# Patient Record
Sex: Male | Born: 1970 | Race: Black or African American | Hispanic: No | Marital: Single | Smoking: Former smoker
Health system: Southern US, Community
[De-identification: ages and names within clinical notes are randomized; demographics above are authoritative.]

## PROBLEM LIST (undated history)

## (undated) DIAGNOSIS — R0989 Other specified symptoms and signs involving the circulatory and respiratory systems: Secondary | ICD-10-CM

## (undated) DIAGNOSIS — I219 Acute myocardial infarction, unspecified: Secondary | ICD-10-CM

## (undated) DIAGNOSIS — H547 Unspecified visual loss: Secondary | ICD-10-CM

## (undated) DIAGNOSIS — G4730 Sleep apnea, unspecified: Secondary | ICD-10-CM

## (undated) DIAGNOSIS — I10 Essential (primary) hypertension: Secondary | ICD-10-CM

## (undated) DIAGNOSIS — K219 Gastro-esophageal reflux disease without esophagitis: Secondary | ICD-10-CM

## (undated) DIAGNOSIS — I509 Heart failure, unspecified: Secondary | ICD-10-CM

## (undated) DIAGNOSIS — F6381 Intermittent explosive disorder: Secondary | ICD-10-CM

## (undated) DIAGNOSIS — Z87448 Personal history of other diseases of urinary system: Secondary | ICD-10-CM

## (undated) HISTORY — DX: Gastro-esophageal reflux disease without esophagitis: K21.9

## (undated) HISTORY — DX: Other specified symptoms and signs involving the circulatory and respiratory systems: R09.89

## (undated) HISTORY — DX: Essential (primary) hypertension: I10

## (undated) HISTORY — DX: Intermittent explosive disorder: F63.81

## (undated) HISTORY — DX: Personal history of other diseases of urinary system: Z87.448

## (undated) HISTORY — DX: Heart failure, unspecified: I50.9

## (undated) HISTORY — DX: Sleep apnea, unspecified: G47.30

## (undated) HISTORY — DX: Acute myocardial infarction, unspecified: I21.9

## (undated) HISTORY — DX: Unspecified visual loss: H54.7

---

## 1997-10-12 ENCOUNTER — Encounter: Admission: RE | Admit: 1997-10-12 | Discharge: 1998-01-10 | Payer: Self-pay | Admitting: Nephrology

## 1998-01-26 ENCOUNTER — Encounter: Admission: RE | Admit: 1998-01-26 | Discharge: 1998-04-26 | Payer: Self-pay | Admitting: Nephrology

## 1998-03-01 ENCOUNTER — Emergency Department (HOSPITAL_COMMUNITY): Admission: EM | Admit: 1998-03-01 | Discharge: 1998-03-01 | Payer: Self-pay | Admitting: Emergency Medicine

## 1998-11-23 ENCOUNTER — Encounter: Admission: RE | Admit: 1998-11-23 | Discharge: 1999-02-21 | Payer: Self-pay | Admitting: Nephrology

## 1998-12-23 ENCOUNTER — Ambulatory Visit (HOSPITAL_COMMUNITY): Admission: RE | Admit: 1998-12-23 | Discharge: 1998-12-23 | Payer: Self-pay | Admitting: Nephrology

## 1999-06-27 ENCOUNTER — Encounter: Admission: RE | Admit: 1999-06-27 | Discharge: 1999-09-25 | Payer: Self-pay | Admitting: Nephrology

## 1999-12-08 ENCOUNTER — Encounter: Admission: RE | Admit: 1999-12-08 | Discharge: 2000-03-07 | Payer: Self-pay | Admitting: Nephrology

## 2000-08-02 ENCOUNTER — Encounter: Admission: RE | Admit: 2000-08-02 | Discharge: 2000-10-31 | Payer: Self-pay | Admitting: Nephrology

## 2000-11-13 ENCOUNTER — Encounter: Admission: RE | Admit: 2000-11-13 | Discharge: 2001-02-11 | Payer: Self-pay | Admitting: Nephrology

## 2001-04-19 ENCOUNTER — Encounter: Admission: RE | Admit: 2001-04-19 | Discharge: 2001-07-18 | Payer: Self-pay | Admitting: Nephrology

## 2001-04-24 ENCOUNTER — Emergency Department (HOSPITAL_COMMUNITY): Admission: EM | Admit: 2001-04-24 | Discharge: 2001-04-24 | Payer: Self-pay | Admitting: *Deleted

## 2001-07-05 ENCOUNTER — Emergency Department (HOSPITAL_COMMUNITY): Admission: EM | Admit: 2001-07-05 | Discharge: 2001-07-05 | Payer: Self-pay | Admitting: Emergency Medicine

## 2001-10-10 ENCOUNTER — Encounter: Admission: RE | Admit: 2001-10-10 | Discharge: 2002-01-08 | Payer: Self-pay | Admitting: Nephrology

## 2002-06-03 ENCOUNTER — Encounter: Admission: RE | Admit: 2002-06-03 | Discharge: 2002-09-01 | Payer: Self-pay | Admitting: Nephrology

## 2002-09-25 ENCOUNTER — Ambulatory Visit (HOSPITAL_BASED_OUTPATIENT_CLINIC_OR_DEPARTMENT_OTHER): Admission: RE | Admit: 2002-09-25 | Discharge: 2002-09-25 | Payer: Self-pay | Admitting: Nephrology

## 2002-11-04 ENCOUNTER — Encounter: Admission: RE | Admit: 2002-11-04 | Discharge: 2003-02-02 | Payer: Self-pay | Admitting: Nephrology

## 2002-11-27 ENCOUNTER — Ambulatory Visit (HOSPITAL_BASED_OUTPATIENT_CLINIC_OR_DEPARTMENT_OTHER): Admission: RE | Admit: 2002-11-27 | Discharge: 2002-11-27 | Payer: Self-pay | Admitting: Nephrology

## 2003-02-03 ENCOUNTER — Encounter: Admission: RE | Admit: 2003-02-03 | Discharge: 2003-05-04 | Payer: Self-pay | Admitting: Nephrology

## 2003-05-05 ENCOUNTER — Encounter: Admission: RE | Admit: 2003-05-05 | Discharge: 2003-08-03 | Payer: Self-pay | Admitting: Nephrology

## 2003-08-03 ENCOUNTER — Encounter: Admission: RE | Admit: 2003-08-03 | Discharge: 2003-11-01 | Payer: Self-pay | Admitting: Nephrology

## 2003-11-02 ENCOUNTER — Encounter: Admission: RE | Admit: 2003-11-02 | Discharge: 2003-11-02 | Payer: Self-pay | Admitting: Nephrology

## 2004-02-02 ENCOUNTER — Encounter: Admission: RE | Admit: 2004-02-02 | Discharge: 2004-02-25 | Payer: Self-pay | Admitting: Nephrology

## 2004-05-25 ENCOUNTER — Encounter: Admission: RE | Admit: 2004-05-25 | Discharge: 2004-05-25 | Payer: Self-pay | Admitting: Nephrology

## 2004-11-23 ENCOUNTER — Encounter: Admission: RE | Admit: 2004-11-23 | Discharge: 2005-02-21 | Payer: Self-pay | Admitting: Nephrology

## 2004-11-26 ENCOUNTER — Emergency Department (HOSPITAL_COMMUNITY): Admission: EM | Admit: 2004-11-26 | Discharge: 2004-11-26 | Payer: Self-pay | Admitting: Emergency Medicine

## 2005-03-07 ENCOUNTER — Encounter: Admission: RE | Admit: 2005-03-07 | Discharge: 2005-06-05 | Payer: Self-pay | Admitting: Nephrology

## 2005-08-29 ENCOUNTER — Encounter: Admission: RE | Admit: 2005-08-29 | Discharge: 2005-11-27 | Payer: Self-pay | Admitting: Nephrology

## 2005-11-28 ENCOUNTER — Encounter: Admission: RE | Admit: 2005-11-28 | Discharge: 2005-12-20 | Payer: Self-pay | Admitting: Nephrology

## 2006-04-26 ENCOUNTER — Emergency Department (HOSPITAL_COMMUNITY): Admission: EM | Admit: 2006-04-26 | Discharge: 2006-04-26 | Payer: Self-pay | Admitting: Emergency Medicine

## 2006-05-02 ENCOUNTER — Encounter: Admission: RE | Admit: 2006-05-02 | Discharge: 2006-05-02 | Payer: Self-pay | Admitting: Nephrology

## 2006-05-17 ENCOUNTER — Encounter: Admission: RE | Admit: 2006-05-17 | Discharge: 2006-08-15 | Payer: Self-pay | Admitting: Nephrology

## 2006-08-16 ENCOUNTER — Encounter: Admission: RE | Admit: 2006-08-16 | Discharge: 2006-11-14 | Payer: Self-pay | Admitting: Nephrology

## 2006-12-27 ENCOUNTER — Encounter: Admission: RE | Admit: 2006-12-27 | Discharge: 2006-12-27 | Payer: Self-pay | Admitting: Nephrology

## 2007-03-26 ENCOUNTER — Encounter: Admission: RE | Admit: 2007-03-26 | Discharge: 2007-03-26 | Payer: Self-pay | Admitting: Nephrology

## 2007-06-25 ENCOUNTER — Encounter: Admission: RE | Admit: 2007-06-25 | Discharge: 2007-06-25 | Payer: Self-pay | Admitting: Nephrology

## 2007-09-24 ENCOUNTER — Encounter: Admission: RE | Admit: 2007-09-24 | Discharge: 2007-09-24 | Payer: Self-pay | Admitting: Nephrology

## 2007-12-24 ENCOUNTER — Encounter: Admission: RE | Admit: 2007-12-24 | Discharge: 2007-12-24 | Payer: Self-pay | Admitting: Nephrology

## 2008-03-24 ENCOUNTER — Encounter: Admission: RE | Admit: 2008-03-24 | Discharge: 2008-05-13 | Payer: Self-pay | Admitting: Nephrology

## 2008-07-16 ENCOUNTER — Encounter: Admission: RE | Admit: 2008-07-16 | Discharge: 2008-07-16 | Payer: Self-pay | Admitting: Nephrology

## 2008-10-15 ENCOUNTER — Encounter: Admission: RE | Admit: 2008-10-15 | Discharge: 2008-10-15 | Payer: Self-pay | Admitting: Nephrology

## 2009-02-17 ENCOUNTER — Encounter: Admission: RE | Admit: 2009-02-17 | Discharge: 2009-02-17 | Payer: Self-pay | Admitting: Nephrology

## 2009-08-31 ENCOUNTER — Encounter: Admission: RE | Admit: 2009-08-31 | Discharge: 2009-08-31 | Payer: Self-pay | Admitting: Nephrology

## 2010-02-15 ENCOUNTER — Encounter: Admission: RE | Admit: 2010-02-15 | Discharge: 2010-04-07 | Payer: Self-pay | Admitting: Nephrology

## 2010-05-23 ENCOUNTER — Encounter
Admission: RE | Admit: 2010-05-23 | Discharge: 2010-05-23 | Payer: Self-pay | Source: Home / Self Care | Attending: Nephrology | Admitting: Nephrology

## 2010-06-13 ENCOUNTER — Ambulatory Visit: Payer: Self-pay | Admitting: Vascular Surgery

## 2010-06-20 ENCOUNTER — Emergency Department (HOSPITAL_COMMUNITY)
Admission: EM | Admit: 2010-06-20 | Discharge: 2010-06-20 | Payer: Self-pay | Source: Home / Self Care | Admitting: Emergency Medicine

## 2010-08-29 ENCOUNTER — Encounter: Admit: 2010-08-29 | Payer: Self-pay | Admitting: Nephrology

## 2010-08-29 ENCOUNTER — Encounter: Payer: Medicaid Other | Attending: Nephrology | Admitting: Dietician

## 2010-08-29 DIAGNOSIS — E119 Type 2 diabetes mellitus without complications: Secondary | ICD-10-CM | POA: Insufficient documentation

## 2010-08-29 DIAGNOSIS — Z713 Dietary counseling and surveillance: Secondary | ICD-10-CM | POA: Insufficient documentation

## 2010-11-22 NOTE — Procedures (Signed)
CAROTID DUPLEX EXAM   INDICATION:  Calcifications on previous study at the dental office.   HISTORY:  Diabetes:  Yes.  Cardiac:  No.  Hypertension:  Yes.  Smoking:  No.  Previous Surgery:  No.  CV History:  History of vertigo.  Amaurosis Fugax No, Paresthesias No, Hemiparesis No                                       RIGHT             LEFT  Brachial systolic pressure:         136               134  Brachial Doppler waveforms:         WNL               WNL  Vertebral direction of flow:        Antegrade         Antegrade  DUPLEX VELOCITIES (cm/sec)  CCA peak systolic                   89                81  ECA peak systolic                   117               74  ICA peak systolic                   62                77  ICA end diastolic                   18                19  PLAQUE MORPHOLOGY:                  Heterogeneous     Heterogeneous  PLAQUE AMOUNT:                      Minimal           Minimal  PLAQUE LOCATION:                    Proximal ICA      Proximal ICA   IMPRESSION:  Bilaterally no hemodynamically significant stenosis.  Bilateral intimal thickening within the common carotid arteries.   ___________________________________________  Di Kindle. Edilia Bo, M.D.   OD/MEDQ  D:  06/13/2010  T:  06/13/2010  Job:  [id number]

## 2010-11-29 ENCOUNTER — Encounter: Payer: Medicaid Other | Attending: Nephrology | Admitting: Dietician

## 2010-11-29 DIAGNOSIS — Z713 Dietary counseling and surveillance: Secondary | ICD-10-CM | POA: Insufficient documentation

## 2010-11-29 DIAGNOSIS — E119 Type 2 diabetes mellitus without complications: Secondary | ICD-10-CM | POA: Insufficient documentation

## 2011-01-04 DIAGNOSIS — R238 Other skin changes: Secondary | ICD-10-CM

## 2011-01-04 DIAGNOSIS — I10 Essential (primary) hypertension: Secondary | ICD-10-CM | POA: Insufficient documentation

## 2011-01-04 DIAGNOSIS — J45909 Unspecified asthma, uncomplicated: Secondary | ICD-10-CM | POA: Insufficient documentation

## 2011-01-04 DIAGNOSIS — K219 Gastro-esophageal reflux disease without esophagitis: Secondary | ICD-10-CM | POA: Insufficient documentation

## 2011-01-04 DIAGNOSIS — R0989 Other specified symptoms and signs involving the circulatory and respiratory systems: Secondary | ICD-10-CM | POA: Insufficient documentation

## 2011-01-04 DIAGNOSIS — I509 Heart failure, unspecified: Secondary | ICD-10-CM | POA: Insufficient documentation

## 2011-01-04 DIAGNOSIS — M791 Myalgia, unspecified site: Secondary | ICD-10-CM | POA: Insufficient documentation

## 2011-01-04 DIAGNOSIS — E119 Type 2 diabetes mellitus without complications: Secondary | ICD-10-CM | POA: Insufficient documentation

## 2011-01-04 DIAGNOSIS — F419 Anxiety disorder, unspecified: Secondary | ICD-10-CM | POA: Insufficient documentation

## 2011-01-04 DIAGNOSIS — F70 Mild intellectual disabilities: Secondary | ICD-10-CM | POA: Insufficient documentation

## 2011-01-04 DIAGNOSIS — G4730 Sleep apnea, unspecified: Secondary | ICD-10-CM | POA: Insufficient documentation

## 2011-03-07 ENCOUNTER — Ambulatory Visit: Payer: Medicaid Other | Admitting: Dietician

## 2011-03-14 ENCOUNTER — Encounter: Payer: Medicaid Other | Attending: Nephrology | Admitting: Dietician

## 2011-03-14 ENCOUNTER — Encounter: Payer: Self-pay | Admitting: Dietician

## 2011-03-14 DIAGNOSIS — E119 Type 2 diabetes mellitus without complications: Secondary | ICD-10-CM | POA: Insufficient documentation

## 2011-03-14 DIAGNOSIS — Z713 Dietary counseling and surveillance: Secondary | ICD-10-CM | POA: Insufficient documentation

## 2011-03-14 DIAGNOSIS — I10 Essential (primary) hypertension: Secondary | ICD-10-CM

## 2011-03-14 NOTE — Progress Notes (Signed)
  Medical Nutrition Therapy:  Appt start time: 1530 end time:  1600.  Assessment:  Primary concerns today: Diabetes self-management follow-up.  Continues to be cooperative with his current caregiver.  During the summer months has gotten a little less exercise according to his caregiver, but he continues to participates in a number of activities that require him to be active.  He has gained 6 lb since his Kennis Buell 2012 appointment, but still remains at a BMI of 25%.  Reported blood glucose ranges ar at: fasting= 95-105-106 mg and 8:00 PM= 115-135 mg (Depends on his evening meal intake.  If he goes out for a meal, the levels tend to be higher.)  Staff report that they continue to check his feet daily when applying the cream that is prescribed and that he has his nails trimmed every 2-3 months by the podiatrist. MEDICATIONS: Review of medications reveals that her continues the Glipizide  XL continues at 10 mg 2 tablets every morning.  Has had Desmopressin 0.2 mg/day added and the Lorazapam 1 mg was decreased to 1 pill at bedtime. DIETARY INTAKE: 24-hr recall:  B (8:00 AM): Cheerios or oatmeal of a cereal with nuts, milk , and a fruit. Snk ( AM) :None noted  L (12:00-1:00 PM): Sandwich with chips fruit, and a water  Snk (4:00 PM): A pack of peanut butter crackers. D (6:00 PM): A meat, a starch serving and a vegetable.  Snk (PM): None as a rule.    Recent physical activity: Walking at [employer] and bowling.  Progress Towards Goal(s):  In progress.   Nutritional Diagnosis:  [place]-2.1 Inpaired nutrition utilization As related to glucose.  As evidenced by diagnosis of type 2 diabetes and use of the Glipizide XL 20 mg dao;u.    Intervention:  Nutrition Review of current intake and the need for a bedtime snack if the blood glucose level at 8:00 PM is 100 or less mg.  Monitoring/Evaluation:  Dietary intake, exercise, blood glucose levels , and body weight with follow-up in three months.Marland Kitchen

## 2011-06-13 ENCOUNTER — Encounter: Payer: Self-pay | Admitting: Dietician

## 2011-06-13 ENCOUNTER — Encounter: Payer: Medicaid Other | Attending: Nephrology | Admitting: Dietician

## 2011-06-13 DIAGNOSIS — Z713 Dietary counseling and surveillance: Secondary | ICD-10-CM | POA: Insufficient documentation

## 2011-06-13 DIAGNOSIS — E119 Type 2 diabetes mellitus without complications: Secondary | ICD-10-CM | POA: Insufficient documentation

## 2011-06-13 NOTE — Progress Notes (Signed)
  Medical Nutrition Therapy:  Appt start time: 1530 end time:  1600.  Assessment:  Primary concerns today:Diabetes Management follow-up  Weight today is at 149.6 lb, which is a 3.4 lb gain over the last 3 months.  Staying at around 150 lbs gives him a BMI of =/- 25% . He had lost to 140 lbs and this left him limited reserve if he experienced a major illness.  He continues to live at the group home and her has the same group home coordinator Dennie Bible.  Dennie Bible is respectful, patient, and supportive of Ryan Case.  He reports that over the last 2 years, they have not had any issues with him eating in the middle of the night.  He is cooperative regarding his diet.  He enjoys eating out and would eat out every day if the staff would allow it.  Continues to have regular outings with his mother.    MEDICATIONS: Now taking Desmopressin to help with preventing nocturia.  Staff are trying to make sure he is well hydrated earlier in the day yet, have fewer accidents at night.  He appears to take water throughout the day.    DIETARY INTAKE:64in, 149.6 lb, BMI 25.7%.  1600-1700 calories to  to maintain this weight.  Recent physical activity: [place] when at the work site and walking in the park on days at home and on weekends.   Progress Towards Goal(s):  In progress.   Nutritional Diagnosis:  [place]-2.1 Inpaired nutrition utilization As related to glucose.  As evidenced by diagnosis of type 2 diabetes and elevated fasting and post-meal glucose levels..    Intervention:  Nutrition Continue with regular meals and snacks.  Continue to monitor intake during the holidays.    Monitoring/Evaluation:  Dietary intake, exercise, blood glucose levels, and body weight in three months.

## 2011-09-12 ENCOUNTER — Encounter: Payer: Medicaid Other | Attending: Nephrology | Admitting: Dietician

## 2011-09-12 ENCOUNTER — Encounter: Payer: Self-pay | Admitting: Dietician

## 2011-09-12 DIAGNOSIS — E119 Type 2 diabetes mellitus without complications: Secondary | ICD-10-CM | POA: Insufficient documentation

## 2011-09-12 DIAGNOSIS — Z713 Dietary counseling and surveillance: Secondary | ICD-10-CM | POA: Insufficient documentation

## 2011-09-12 NOTE — Progress Notes (Signed)
  Medical Nutrition Therapy:  Appt start time: 1530 end time:  1600.  Assessment:  Primary concerns today: Follow- up for blood glucose control and weight maintenance.   His care taker reports that over the last 3 months he has been stable.  No illnesses or emotional incidences have occurred.  He is actively involved with the others in his group home.  If anything, he Ravis Herne not be getting as much physical activity due to the rainy, cold weather that we have had.  He continues to have weekly or more often, outings for dinner with his Mom.  He Kathryn Linarez have a higher HS glucose on those days, but her generally remains in a stable range.  BLOOD GLUCOSE LEVELS:  Fasting range 95-106-105mg   Bedtime range: 115-150 mg  MEDICATIONS: Review completed with the provider using his MAR.  Continues to have medications that he Hector Venne be using on a PRN basis.  DIETARY INTAKE:  24-hr recall:  B (7:30-8:00 AM): Cereal 1 bowl (raisin bran or corn flakes, or regular cherrioes) milk 8 oz, fruit on side.  Glass of OJ or water Snk (mid AM) :Has lunch at 11:00  L (11-11:30 PM): 2 sandwiches (meat/cheese) bag of chips, piece of fruit, pack of Lance crackers.   Can be divided up.  Water or Crystal Light  Snk (late PM): None unless hungry or feeling glucose changing. D (6:00-6:30 PM): Out with his mother.  At the house, will have a meat, baked chicken, mashed potatoes and broccoli and corn mix.  Water or diet soda.  Snk (HS PM): Only if he ask for one and his blood glucose is appropriate Beverages: water, crystal light.  Recent physical activity: walking, and when goes to gym will walk and shoot some basketball.  Estimated energy needs:HT:64 in  WT: 147.7 lb BMI: 25.4%  Desire is to keep his weight stable around 147-152 lb to have some reserve energy in the case of a major illness.  Given his status, his immunity Tharun Cappella be compromised, leaving him to need some, but not a great deal of reserve energy stores.  1500-1600  calories 170-175 g carbohydrates 115-120 g protein 41-43 g fat  Progress Towards Goal(s):  Some progress.   Nutritional Diagnosis:  [place]-2.1 Inpaired nutrition utilization As related to glucose.  As evidenced by diagnosis of type 2 diabetes and varying and above normal fasting and post meal blood glucose levels.    Intervention:  Nutrition Review of current nutrient intake and strategies with the caregiver to help with weight maintainence .   Monitoring/Evaluation:  Dietary intake, exercise, blood glucose levels, and body weight in three months.

## 2011-09-12 NOTE — Patient Instructions (Signed)
   Keep up the regular meals and snacks that restrict the carbohydrates.  Continue to check his blood glucose twice daily and if he complains of the low glucose, then check blood sugar and treat.  At the next visit with Dr. Bascom Levels have him send [place] a referral to continue to regular blood glucose/diabetes counseling and weight control counseling.

## 2011-09-13 ENCOUNTER — Encounter: Payer: Self-pay | Admitting: Dietician

## 2011-12-12 ENCOUNTER — Encounter: Payer: Medicaid Other | Attending: Nephrology | Admitting: Dietician

## 2011-12-12 DIAGNOSIS — E119 Type 2 diabetes mellitus without complications: Secondary | ICD-10-CM | POA: Insufficient documentation

## 2011-12-12 DIAGNOSIS — Z713 Dietary counseling and surveillance: Secondary | ICD-10-CM | POA: Insufficient documentation

## 2011-12-12 NOTE — Patient Instructions (Signed)
   If having a high blood glucose reading, have him wash his hands and do a second reading.  He Jonay Hitchcock have had something on his hands that would have made the reading high or it might have been a bad drop of blood.  Continue to get the physical activity on the weekends.   Continue to walk at school.   Any time you have questions regarding his meter or it's function, give me a call.  When he has his annual physical with Dr. Bascom Levels, please call me and leave me the results of his A1C level.  Maggie Tedford Berg [phone].

## 2011-12-12 NOTE — Progress Notes (Signed)
  Medical Nutrition Therapy:  Appt start time: 1530 end time:  1600.  Assessment:  Primary concerns today: Continued blood glucose control.  Weight gain of 5 lb over the last 3 months.  Had only this months blood glucose records. Had one markedly elevated blood glucose of 167.  This they treated with hydration.  Otherwise, his glucose levels are fasting for this time in the 98-103 range and his post-meal evening records are in the general < 160 mg.dl range.  His diet and activity levels are remaining basically unchanged.       BLOOD GLUCOSE: 167,99, 98, 103 EVENING (8:00 PM): 122, 116, 141  MEDICATIONS: Medication review with the staff using the MAR.  Has had some medication changes.  For his blood glucose, he remains on the Glipizide XL 20 mg daily.  DIETARY INTAKE:  24-hr recall:  B (7:30-8:00 AM): Continues with the cereal 1 bowl (raisin bran or regular cheerios) with 2 % milk and a fruit on the side.  Will have water or a glass of OJ.  Snk (mid AM) :Usually no snack.  Lunch is at 11:00 AM  L (11:00-11:30 PM): Carries a bag lunch from home.  Ryan Case have 2 sandwiches (meat/cheese) with a bag of chips or a piece of fruit, and will have a pack of the Lance crackers.  Can use these items for snack later in the afternoon.  Snk (mid PM): Usually no snack unless he is hungry or he feels that his blood sugar is running low. D (6:00-6:30 PM): At the house, will have a lean meat or chicken and a starch and a non-starchy vegetable.  When out with his Mom, she tries to make sure that he eats to keep a good blood glucose level. snk (HS PM): Usually none unless he is hungry or has a low blood glucose Beverages: water, water with crystal light.  Limiting the diet soda  Recent physical activity: Continues to participate with the walking at the job site on a daily basis during the week when the weather is good.  Otherwise, his exercise/physical activity is on the weekend.  The staff will take them to the park,  bowling, to the movies, to a restaurant.  Estimated energy needs: 1500-1600 calories 170-175 g carbohydrates 115-120 g protein 41-43 g fat  Progress Towards Goal(s):  In progress.   Nutritional Diagnosis:  [place]-2.1 Inpaired nutrition utilization As related to blood glucose.  As evidenced by diagnosis of type 2 diabetes and use of Glipizide XL for glucose control along with a carb restricted diet..    Intervention:  Nutrition: Reviewed the need to continue to restrict his carb intake and to keep him as physically active as possible.  Again, the goal is to keep his weight at the 147-152 range to have some reserve energy should he experience an illness.   Monitoring/Evaluation:  Dietary intake, exercise, blood glucose levels, and body weight in three months.

## 2011-12-13 ENCOUNTER — Encounter: Payer: Self-pay | Admitting: Dietician

## 2012-06-19 ENCOUNTER — Encounter: Payer: Self-pay | Admitting: Dietician

## 2012-06-19 ENCOUNTER — Encounter: Payer: Medicaid Other | Attending: Nephrology | Admitting: Dietician

## 2012-06-19 DIAGNOSIS — E119 Type 2 diabetes mellitus without complications: Secondary | ICD-10-CM | POA: Insufficient documentation

## 2012-06-19 DIAGNOSIS — Z713 Dietary counseling and surveillance: Secondary | ICD-10-CM | POA: Insufficient documentation

## 2012-06-19 NOTE — Progress Notes (Signed)
Medical Nutrition Therapy:  Appt start time: 1515 end time:  1545.  Assessment:  Primary concerns today: Continuing to keep his diabetes stable. Comes today with the caregiver for his group home setting.  The caregiver is requesting to have his A1C drawn.  The care team is wanting to know where he stands with his diabetes.  He reports that he is not aware that the A1C is being done.  Explained that that is a service that we are no longer able to provide for our patients.  Noted that we will need to get Dr. Bascom Levels to get the A1C with his next blood draw.   Jaynie Collins has gained 2.8 lb since his visit in June 2013.  His BMI is at 26.8 kg/m2.  For him this weigh gain is OK.  Discussed with the caregiver the need to try to keep him in the 150-155 lb range.  Caregiver notes that at this time it is not difficult to help [name] maintain good eating habits at the house, but that when he is out for visitation, he will come back with an elevated blood glucose.  His favorite outing is to eat at the [employer].  His mom will often take him there to eat.  It would be good to see how his A1C stands in relation to his daily blood glucose levels.  BLOOD GLUCOSE MONITORING: Staff did not bring his glucose meter or records.  Did an informal recall.   Fasting: 87-90-102  Evening 8:00 PM  Nights eating at the group home: 115-140  Nights on going to dinner: 160-161.  HYPOGLYCEMIA:  Staff report no incidents of the S/S of low blood glucose.  HYPERGLYCEMIA:  Staff report no incidents of the S/S of hight blood glucose. MEDICATIONS: Completed Med review.  DIETARY INTAKE:  24-hr recall:  B (7:30-8:00 AM): Continues with the cereal (1 bowl of raisin bran or regular cheerios) with 2% milk and a fruit on the side.  Will have water or a glass of OJ.  On the weekends, may have a small waffle instead of the fruit.  Snk (mid AM) :none  L (11:00 AM): Carries a bag lunch from home.  May have 2 sandwiches  (meat/cheese) with a bag of chips or a piece of fruit.   Snk (mid PM): If he is hungry or his sugar is lower, will have a pack of Lance crackers. D (6:00-6:30 PM): At the house, will have a lean meat or chicken and a starch and a non-starchy vegetable.  When out with his mom, he prefers to eat the fried fish.  As a rule she will try to persuade him to try something with less fat and carb.  Snk (HS PM): Usually none unless he is hungry or has a low blood glucose level. Beverages: water, water with crystal light.  Continuing to limit diet soda.  Recent physical activity: Staff note that he is walking more with his !:1 at the day program.  He is less resistant to walking and has been walking longer and further.  He will walk on the warmer days.  On weekends, the group may go for a walk at the park.  Estimated energy needs:   HT: 64 in  WT: 155.8 lb   BMI: 26.8 kg/m2   Adj WT: 140 lb (64 kg) 1500-1600 calories 170-175 g carbohydrates 115-120 g protein 41-43 g fat  Progress Towards Goal(s):  In progress.   Nutritional Diagnosis:  [place]-2.1 Inpaired nutrition utilization As  related to glucose.  As evidenced by diagnosis of type 2 diabetes and the use of Glipiside XL for blood glucoe control along with a carb restricted diet..    Intervention:  Nutrition Review of the need to continue to monitor and limit carb intake.  Recommended continuing to try to keep him as active as possible and to continue to encourage his walking.  Continue to moderate his portions and monitor his blood glucose levels and aim for the fastin glucose levels of 90-110 mg/dl and the 2 hours after the first bite of the meal at 120-150 mg/dl. Continue to use the fruits and to limit the use of the starchy carbs.  Try for using the fruit rather than the juice.   Monitoring/Evaluation:  Dietary intake, exercise, blood glucose levels, and body weight in three months.Marland Kitchen

## 2012-09-17 ENCOUNTER — Encounter: Payer: Medicaid Other | Attending: Nephrology | Admitting: Dietician

## 2012-09-17 ENCOUNTER — Encounter: Payer: Self-pay | Admitting: Dietician

## 2012-09-17 DIAGNOSIS — E119 Type 2 diabetes mellitus without complications: Secondary | ICD-10-CM | POA: Insufficient documentation

## 2012-09-17 DIAGNOSIS — Z713 Dietary counseling and surveillance: Secondary | ICD-10-CM | POA: Insufficient documentation

## 2012-09-17 NOTE — Progress Notes (Signed)
  Medical Nutrition Therapy:  Appt start time: 1515 end time:  1545.  Assessment:  Primary concerns today: F/U for diet, blood glucose and weight control.  Comes today with his care giver from his group home living situation.  Staff reported that his habits have remained much the same.  He may be walking more with the group staff members at the work site.  His mother continues to see him on a regular basis and he looks forward to their outings to eat.  He especially enjoys going to the [employer].  Since his last visit on 06/19/2012, he has lost 3 lbs.   HYPERGLYCEMIA:  Staff report no S/S that he has made them aware of.  HYPOGLYCEMIA:  No blood glucose levels less than the 70 mg.range.  BLOOD GLUCOSE MONITORING:  Continues to monitor twice daily, fasting and after dinner/before bed.    Fasting: 90-140 mg    HS: 98-140 mg   MEDICATIONS: Medication review completed/  DIETARY INTAKE:  24-hr recall:  B (7:30-8;00 AM): cerea Raisin Ban with 2 % milk or  Waffles/pancakes 2 pancakes with a bacon or sausage. Syrup sugar free OR oatmeal  Package, uses regular with Splenda Snk (mid AM) :none  L (11:30 PM): 2 baloney and or [place] or ham sandwich with bag chips, apple or orange , water.  Snk (mid  PM): PB crackers D (6:30-7:30 PM): Hamburger helper, corn and collard greens, water.  Snk (night  PM): Situational will have package of PB crackers.  Beverages: Having primarily water, diet soda having 1 glass 1-2 times per week.  Not drinking a lot  Recent physical activity: Day center making greater effort for having more activity.  Estimated energy needs:  HT: 64 in  WT:152.8 lb  BMI: 26.3 kg/m2   1500-1600 calories 170-175 g carbohydrates 115-120 g protein 41-43 g fat  Progress Towards Goal(s):  Some progress.   Nutritional Diagnosis:  [place]-2.1 Inpaired nutrition utilization As related to glucose.  As evidenced by diagnosis of type 2 diabetes with the use of Glipisise XL for  blood glucose control along with a carb restricted diet .    Intervention:  Nutrition:  If his glucose at bedtime is at 90 mg, then off him the cheese and crackers.  If the glucose is 110 mg or greater, then he will not need a snack.  Continue to help him to stay active with his walking.  Needs the walking on a daily basis for the glucose control.  Keep up the protein/meats in his diet.  Continue to use lean protein sources.  If he is asking for more foods, try to use the lean proteins rather than a starch or other carb.  If using a protein bar, look for a whey based protein bar.  F/U in 3 months.  Monitoring/Evaluation:  Dietary intake, exercise, blood glucose , and body weight in 3 months.

## 2012-10-05 ENCOUNTER — Encounter: Payer: Self-pay | Admitting: Dietician

## 2012-10-05 NOTE — Patient Instructions (Signed)
   If his glucose at bedtime is at 90 mg, then off him the cheese and crackers.  If the glucose is 110 mg or greater, then he will not need a snack.  Continue to help him to stay active with his walking.  Needs the walking on a daily basis for the glucose control.  Keep up the protein/meats in his diet.  Continue to use lean protein sources.  If he is asking for more foods, try to use the lean proteins rather than a starch or other carb.  If using a protein bar, look for a whey based protein bar.  F/U in 3 months.

## 2013-02-18 ENCOUNTER — Encounter: Payer: Self-pay | Admitting: *Deleted

## 2013-02-18 ENCOUNTER — Encounter: Payer: Medicaid Other | Attending: Family Medicine | Admitting: *Deleted

## 2013-02-18 DIAGNOSIS — Z713 Dietary counseling and surveillance: Secondary | ICD-10-CM | POA: Insufficient documentation

## 2013-02-18 DIAGNOSIS — R7309 Other abnormal glucose: Secondary | ICD-10-CM | POA: Insufficient documentation

## 2013-02-18 NOTE — Progress Notes (Signed)
  Medical Nutrition Therapy:  Appt start time: 1530 end time:  1600.  Assessment:  Primary concerns today: F/U for diet, blood glucose and weight control. He has previously worked with Seward Grater May, RN, RD, CDE who has retired so this is my first visit with this patient.  Mother is here with Ryan Case for the appointment. She states he is eating well, is careful with his food choices and attempts to exercise daily by walking . They are in need of a new meter and will need a Rx for strips and lancets also. Weight is stable since last visit in March at 152.3 lbs. Mother states he was testing about once a day until he ran out of strips.  HYPERGLYCEMIA:  Staff report no S/S that he has made them aware of.  HYPOGLYCEMIA:  No reported blood glucose levels less than the 70 mg.range.  MEDICATIONS: Medication review completed/  Recent physical activity: Day center making greater effort for having more activity.  Estimated energy needs:  HT: 64 in  WT:152.8 lb  BMI: 26.3 kg/m2   1500-1600 calories 170-175 g carbohydrates 115-120 g protein 41-43 g fat  Progress Towards Goal(s):  Some progress.   Nutritional Diagnosis:  [place]-2.1 Inpaired nutrition utilization As related to glucose.  As evidenced by diagnosis of type 2 diabetes with the use of Glipisise XL for blood glucose control along with a carb restricted diet .    Intervention:  Nutrition reviewed and deemed appropriate at this time.   If his glucose at bedtime is at 90 mg, then off him the cheese and crackers.  If the glucose is 110 mg or greater, then he will not need a snack.  Continue to help him to stay active with his walking.  Needs the walking on a daily basis for the glucose control.  Keep up the protein/meats in his diet.  Continue to use lean protein sources.  If he is asking for more foods, try to use the lean proteins rather than a starch or other carb.  If using a protein bar, look for a whey based protein bar.  Ask MD for Rx for  Accu Chek Nano meter strips and Fast Clix Lancing Device Drum which holds 6 lancets each  If he breaks out in a cold sweat or feels shaky, check his blood sugar to see if it is too low (below 70 mg/dl). If so, drink 4 oz fruit juice or regular soda to bring blood sugar up quickly.  F/U in 3 months.   Monitoring/Evaluation:  Dietary intake, exercise, blood glucose , and body weight in 3 months.

## 2013-02-18 NOTE — Patient Instructions (Addendum)
   If his glucose at bedtime is at 90 mg, then off him the cheese and crackers.  If the glucose is 110 mg or greater, then he will not need a snack.  Continue to help him to stay active with his walking.  Needs the walking on a daily basis for the glucose control.  Keep up the protein/meats in his diet.  Continue to use lean protein sources.  If he is asking for more foods, try to use the lean proteins rather than a starch or other carb.  If using a protein bar, look for a whey based protein bar.  Ask MD for Rx for Accu Chek Nano meter strips and Fast Clix Lancing Device Drum which holds 6 lancets each  If he breaks out in a cold sweat or feels shaky, check his blood sugar to see if it is too low (below 70 mg/dl). If so, drink 4 oz fruit juice or regular soda to bring blood sugar up quickly.  F/U in 3 months.

## 2013-02-21 ENCOUNTER — Emergency Department (HOSPITAL_COMMUNITY)
Admission: EM | Admit: 2013-02-21 | Discharge: 2013-02-21 | Disposition: A | Discharge status: Home / Self Care | Payer: Medicaid Other | Source: Home / Self Care

## 2013-02-21 ENCOUNTER — Encounter (HOSPITAL_COMMUNITY): Payer: Self-pay | Admitting: Emergency Medicine

## 2013-02-21 DIAGNOSIS — S0003XA Contusion of scalp, initial encounter: Secondary | ICD-10-CM

## 2013-02-21 DIAGNOSIS — S0012XA Contusion of left eyelid and periocular area, initial encounter: Secondary | ICD-10-CM

## 2013-02-21 NOTE — ED Notes (Signed)
Left face pain around left eye, visible bruising around left eye.  Patient reports running into wall during the night while walking to the bathroom.  No change in vision.

## 2013-02-21 NOTE — ED Provider Notes (Signed)
Ryan Case is a 42 y.o. male who presents to Urgent Care today for left eyebrow contusion. Patient has MRI or and lives in a group home. 2 nights ago he was walking in the house at night and accidentally walked into a door jam. He apparently does this with some regularity. He was not wearing his glasses at that time. He suffered a contusion to the left eyebrow. He denies any pain blurry vision and feels well otherwise. He has not tried any medications. No fevers or chills nausea vomiting or diarrhea.   PMH reviewed. As above History  Substance Use Topics  . Smoking status: Never Smoker   . Smokeless tobacco: Never Used  . Alcohol Use: No   ROS as above Medications reviewed. No current facility-administered medications for this encounter.   Current Outpatient Prescriptions  Medication Sig Dispense Refill  . amitriptyline (ELAVIL) 25 MG tablet Take 25 mg by mouth at bedtime.        Marland Kitchen aspirin 81 MG tablet Take 81 mg by mouth daily.        . benztropine (COGENTIN) 1 MG tablet Take 1 mg by mouth daily.        . Betamethasone Valerate (LUXIQ EX) Apply 17 % topically as needed.        . carvedilol (COREG) 3.125 MG tablet Take 3.125 mg by mouth 2 (two) times daily with a meal.        . ciclopirox (LOPROX) 0.77 % cream Apply topically as needed.        . desmopressin (DDAVP) 0.2 MG tablet Take 0.2 mg by mouth daily.        Marland Kitchen Dextrose, Diabetic Use, (GLUCOSE PO) Take by mouth.        . digoxin (LANOXIN) 0.25 MG tablet Take 250 mcg by mouth daily.        . famotidine (PEPCID) 20 MG tablet Take 20 mg by mouth daily.        . fenofibrate (TRICOR) 145 MG tablet Take 48 mg by mouth daily.       . furosemide (LASIX) 40 MG tablet Take 40 mg by mouth daily.        Marland Kitchen gabapentin (NEURONTIN) 400 MG capsule Take 400 mg by mouth 3 (three) times daily. Takes all three capsules at bedtime.      Marland Kitchen glipiZIDE (GLIPIZIDE XL) 10 MG 24 hr tablet Take 20 mg by mouth daily.        . Loratadine 10 MG CAPS Take by  mouth daily. Taking PRN.      . LORazepam (ATIVAN) 1 MG tablet Take 1 mg by mouth daily.       Marland Kitchen LORAZEPAM PO Take 0.5 mg by mouth daily.       . Metoclopramide HCl 10 MG TBDP Take 1 tablet by mouth 2 (two) times daily.        Marland Kitchen OMEPRAZOLE PO Take 20 mg by mouth daily.        Marland Kitchen POTASSIUM CHLORIDE CR PO Take 10 mg by mouth 2 (two) times daily.        . risperiDONE (RISPERDAL) 3 MG tablet Take 3 mg by mouth 2 (two) times daily.        . theophylline (THEODUR) 200 MG 12 hr tablet Take 200 mg by mouth 2 (two) times daily.          Exam:  BP 124/64  Pulse 74  Temp(Src) 98.5 F (36.9 C) (Oral)  Resp 18  SpO2 98%  Gen: Well NAD HEENT: EOMI,  MMM PERRLA. Lungs: CTABL Nl WOB Heart: RRR no MRG Abd: NABS, NT, ND Exts: Non edematous BL  LE, warm and well perfused.  Skin: Contusion and ecchymosis on the inferior portion of the left eyebrow. No erythema  No results found for this or any previous visit (from the past 24 hour(s)). No results found.  Assessment and Plan: 42 y.o. male with eyebrow contusion.  Watchful waiting. Tylenol as needed for pain. Followup if not improving or worsening. Discussed warning signs or symptoms. Please see discharge instructions. Patient expresses understanding.      Rodolph Bong, MD 02/21/13 [phone]

## 2013-04-29 ENCOUNTER — Ambulatory Visit (INDEPENDENT_AMBULATORY_CARE_PROVIDER_SITE_OTHER): Payer: Medicaid Other

## 2013-04-29 VITALS — BP 124/75 | HR 72 | Resp 20 | Ht 66.0 in | Wt 155.0 lb

## 2013-04-29 DIAGNOSIS — E1140 Type 2 diabetes mellitus with diabetic neuropathy, unspecified: Secondary | ICD-10-CM

## 2013-04-29 DIAGNOSIS — M79609 Pain in unspecified limb: Secondary | ICD-10-CM

## 2013-04-29 DIAGNOSIS — E1149 Type 2 diabetes mellitus with other diabetic neurological complication: Secondary | ICD-10-CM

## 2013-04-29 DIAGNOSIS — B351 Tinea unguium: Secondary | ICD-10-CM

## 2013-04-29 DIAGNOSIS — E1142 Type 2 diabetes mellitus with diabetic polyneuropathy: Secondary | ICD-10-CM

## 2013-04-29 NOTE — Progress Notes (Signed)
  Subjective:    Patient ID: Ryan Case, male    DOB: [birth year], 42 y.o.   MRN: [id number] "Trim my toenails."   HPI no changes in health or history patient has thick brittle crumbly different dystrophic probably criptotic nails tender on palpation and pushing. History of diabetes unchanged no changes medication her health history    Review of Systems Not reviewed    Objective:   Physical Exam  Neurovascular status is unchanged pedal pulses DP pulse 2 for PT thready palpable bilateral skin temperature warm to cool turgor diminished no edema no varicosities noted nails thick brittle dystrophic friable criptotic x10 tender on palpation with enclosed shoe wear. Biomechanical exam unremarkable mild flexible digital contractures noted 2 through 5 bilateral      Assessment & Plan:  Onychomycosis, diabetes with peripheral neuropathy. Painful mycotic nails debrided x10 the presence diabetes and complicated factors. Return for palliative nail care in the future on an as-needed basis maintain appropriate candidate shoes at all times. Alvan Dame DPM

## 2013-04-29 NOTE — Patient Instructions (Signed)

## 2013-05-20 ENCOUNTER — Encounter: Payer: Medicaid Other | Attending: Family Medicine | Admitting: *Deleted

## 2013-05-20 VITALS — Ht 66.0 in | Wt 156.7 lb

## 2013-05-20 DIAGNOSIS — R7309 Other abnormal glucose: Secondary | ICD-10-CM | POA: Insufficient documentation

## 2013-05-20 DIAGNOSIS — Z713 Dietary counseling and surveillance: Secondary | ICD-10-CM | POA: Insufficient documentation

## 2013-05-20 DIAGNOSIS — E119 Type 2 diabetes mellitus without complications: Secondary | ICD-10-CM

## 2013-05-20 NOTE — Progress Notes (Signed)
  Medical Nutrition Therapy:  Appt start time: 1530 end time:  1600.  Assessment:  Primary concerns today: F/U for diet, blood glucose and weight control. Patient is here with his caregiver today instead of his mother. He states he continues to eat well, is careful with his food choices and attempts to exercise daily by walking . He has obtained a new meter and is testing his BG successfully. Weight is up slightly since last visit in June at 156.7 lbs. No problems offered today.  MEDICATIONS: Medication review completed/  Recent physical activity: Day center making greater effort for having more activity.  Estimated energy needs:  1500-1600 calories 170-175 g carbohydrates 115-120 g protein 41-43 g fat  Progress Towards Goal(s):  Some progress.   Nutritional Diagnosis:  [place]-2.1 Inpaired nutrition utilization As related to glucose.  As evidenced by diagnosis of type 2 diabetes with the use of Glipisise XL for blood glucose control along with a carb restricted diet .    Intervention:  Nutrition reviewed and deemed appropriate at this time. Continue:  If his glucose at bedtime is at 90 mg, then off him the cheese and crackers.  If the glucose is 110 mg or greater, then he will not need a snack.  Continue to help him to stay active with his walking.  Needs the walking on a daily basis for the glucose control.  Keep up the protein/meats in his diet.  Continue to use lean protein sources.  If he is asking for more foods, try to use the lean proteins rather than a starch or other carb.  If using a protein bar, look for a whey based protein bar.  If he breaks out in a cold sweat or feels shaky, check his blood sugar to see if it is too low (below 70 mg/dl). If so, drink 4 oz fruit juice or regular soda to bring blood sugar up quickly.  F/U in 3 months.   Monitoring/Evaluation:  Dietary intake, exercise, blood glucose , and body weight in 3 months.

## 2013-06-02 ENCOUNTER — Encounter: Payer: Self-pay | Admitting: *Deleted

## 2013-08-05 ENCOUNTER — Ambulatory Visit: Payer: Medicaid Other

## 2013-08-20 ENCOUNTER — Ambulatory Visit: Payer: Medicaid Other | Admitting: *Deleted

## 2013-08-21 ENCOUNTER — Encounter: Payer: Medicaid Other | Attending: Family Medicine | Admitting: *Deleted

## 2013-08-21 ENCOUNTER — Encounter: Payer: Self-pay | Admitting: *Deleted

## 2013-08-21 VITALS — Ht 66.0 in | Wt 155.0 lb

## 2013-08-21 DIAGNOSIS — E119 Type 2 diabetes mellitus without complications: Secondary | ICD-10-CM

## 2013-08-21 DIAGNOSIS — R7309 Other abnormal glucose: Secondary | ICD-10-CM | POA: Insufficient documentation

## 2013-08-21 DIAGNOSIS — Z713 Dietary counseling and surveillance: Secondary | ICD-10-CM | POA: Insufficient documentation

## 2013-08-21 NOTE — Patient Instructions (Signed)
   If his glucose at bedtime is at 90 mg, then off him the cheese and crackers.  If the glucose is 110 mg or greater, then he will not need a snack.  Continue to help him to stay active with his walking.  Needs the walking on a daily basis for the glucose control.  Keep up the protein/meats in his diet.  Continue to use lean protein sources.  If he is asking for more foods, try to use the lean proteins rather than a starch or other carb.  If using a protein bar, look for a whey based protein bar.  If he breaks out in a cold sweat or feels shaky, check his blood sugar to see if it is too low (below 70 mg/dl). If so, drink 4 oz fruit juice or regular soda to bring blood sugar up quickly.  F/U in 3 months.  KEEP UP THE GOOD WORK!

## 2013-08-21 NOTE — Progress Notes (Signed)
  Medical Nutrition Therapy:  Appt start time: 1530 end time:  1600.  Assessment:  Primary concerns today: F/U for diet, blood glucose and weight control. Patient is here with his caregiver today who reports he continues to do very well with his weight and BG control. Weight today is stable at 155 pounds and reported BG ranges are 92 - 103 pre breakfast and 115 - 145 post supper. He walks often and doing well with his food choices.  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  Estimated energy needs:  1500-1600 calories 170-175 g carbohydrates 115-120 g protein 41-43 g fat  Intervention:  Nutrition reviewed and deemed appropriate at this time. Continue:  If his glucose at bedtime is at 90 mg, then off him the cheese and crackers.  If the glucose is 110 mg or greater, then he will not need a snack.  Continue to help him to stay active with his walking.  Needs the walking on a daily basis for the glucose control.  Keep up the protein/meats in his diet.  Continue to use lean protein sources.  If he is asking for more foods, try to use the lean proteins rather than a starch or other carb.  If using a protein bar, look for a whey based protein bar.  If he breaks out in a cold sweat or feels shaky, check his blood sugar to see if it is too low (below 70 mg/dl). If so, drink 4 oz fruit juice or regular soda to bring blood sugar up quickly.  F/U in 3 months.  KEEP UP THE GOOD WORK!   Monitoring/Evaluation:  Dietary intake, exercise, blood glucose , and body weight in 3 months.

## 2013-11-18 ENCOUNTER — Ambulatory Visit: Payer: Medicaid Other

## 2013-11-18 ENCOUNTER — Ambulatory Visit (INDEPENDENT_AMBULATORY_CARE_PROVIDER_SITE_OTHER): Payer: Medicaid Other

## 2013-11-18 VITALS — BP 157/91 | HR 95 | Resp 15 | Ht 66.0 in | Wt 157.0 lb

## 2013-11-18 DIAGNOSIS — E1149 Type 2 diabetes mellitus with other diabetic neurological complication: Secondary | ICD-10-CM

## 2013-11-18 DIAGNOSIS — M79609 Pain in unspecified limb: Secondary | ICD-10-CM

## 2013-11-18 DIAGNOSIS — B351 Tinea unguium: Secondary | ICD-10-CM

## 2013-11-18 DIAGNOSIS — E1140 Type 2 diabetes mellitus with diabetic neuropathy, unspecified: Secondary | ICD-10-CM

## 2013-11-18 NOTE — Progress Notes (Signed)
   Subjective:    Patient ID: Ryan Case, male    DOB: [birth year], 43 y.o.   MRN: [id number]  HPI Comments: Pt presents for debridement.     Review of Systems no new systemic changes or findings noted     Objective:   Physical Exam Neurovascular status is unchanged pedal pulses palpable DP +2/4 bilateral PT thready one over 4 bilateral pictures warm turgor diminished no edema rubor pallor mild varicosities noted neurologically epicritic and proprioceptive sensations decreased on Semmes Weinstein testing to the digits and forefoot nails thick brittle crumbly friable and criptotic incurvated biomechanical exam unremarkable. Digital contractures 2 through 5 bilateral       Assessment & Plan:  Assessment this time his diabetes with peripheral neuropathy onychomycosis friable mycotic nails painful symptomatic in ingrowing debridement 1 through 5 bilateral return for future palliative nail care as needed for followup  Alvan Dameichard Farha Dano DPM

## 2013-11-18 NOTE — Patient Instructions (Signed)
Diabetes and Foot Care Diabetes may cause you to have problems because of poor blood supply (circulation) to your feet and legs. This may cause the skin on your feet to become thinner, break easier, and heal more slowly. Your skin may become dry, and the skin may peel and crack. You may also have nerve damage in your legs and feet causing decreased feeling in them. You may not notice minor injuries to your feet that could lead to infections or more serious problems. Taking care of your feet is one of the most important things you can do for yourself.  HOME CARE INSTRUCTIONS  Wear shoes at all times, even in the house. Do not go barefoot. Bare feet are easily injured.  Check your feet daily for blisters, cuts, and redness. If you cannot see the bottom of your feet, use a mirror or ask someone for help.  Wash your feet with warm water (do not use hot water) and mild soap. Then pat your feet and the areas between your toes until they are completely dry. Do not soak your feet as this can dry your skin.  Apply a moisturizing lotion or petroleum jelly (that does not contain alcohol and is unscented) to the skin on your feet and to dry, brittle toenails. Do not apply lotion between your toes.  Trim your toenails straight across. Do not dig under them or around the cuticle. File the edges of your nails with an emery board or nail file.  Do not cut corns or calluses or try to remove them with medicine.  Wear clean socks or stockings every day. Make sure they are not too tight. Do not wear knee-high stockings since they may decrease blood flow to your legs.  Wear shoes that fit properly and have enough cushioning. To break in new shoes, wear them for just a few hours a day. This prevents you from injuring your feet. Always look in your shoes before you put them on to be sure there are no objects inside.  Do not cross your legs. This may decrease the blood flow to your feet.  If you find a minor scrape,  cut, or break in the skin on your feet, keep it and the skin around it clean and dry. These areas may be cleansed with mild soap and water. Do not cleanse the area with peroxide, alcohol, or iodine.  When you remove an adhesive bandage, be sure not to damage the skin around it.  If you have a wound, look at it several times a day to make sure it is healing.  Do not use heating pads or hot water bottles. They may burn your skin. If you have lost feeling in your feet or legs, you may not know it is happening until it is too late.  Make sure your health care provider performs a complete foot exam at least annually or more often if you have foot problems. Report any cuts, sores, or bruises to your health care provider immediately. SEEK MEDICAL CARE IF:   You have an injury that is not healing.  You have cuts or breaks in the skin.  You have an ingrown nail.  You notice redness on your legs or feet.  You feel burning or tingling in your legs or feet.  You have pain or cramps in your legs and feet.  Your legs or feet are numb.  Your feet always feel cold. SEEK IMMEDIATE MEDICAL CARE IF:   There is increasing redness,   swelling, or pain in or around a wound.  There is a red line that goes up your leg.  Pus is coming from a wound.  You develop a fever or as directed by your health care provider.  You notice a bad smell coming from an ulcer or wound. Document Released: 06/23/2000 Document Revised: 02/26/2013 Document Reviewed: 12/03/2012 Exit[employer] Patient Information 2014 [employer].  

## 2013-11-20 ENCOUNTER — Ambulatory Visit: Payer: Medicaid Other | Admitting: *Deleted

## 2013-12-23 ENCOUNTER — Encounter: Payer: Medicaid Other | Attending: Family Medicine | Admitting: *Deleted

## 2013-12-23 DIAGNOSIS — Z713 Dietary counseling and surveillance: Secondary | ICD-10-CM | POA: Diagnosis not present

## 2013-12-23 DIAGNOSIS — E119 Type 2 diabetes mellitus without complications: Secondary | ICD-10-CM | POA: Diagnosis not present

## 2013-12-23 NOTE — Progress Notes (Signed)
  Medical Nutrition Therapy:  Appt start time: 1530 end time:  1600.  Assessment:  Primary concerns today: F/U for diet, blood glucose and weight control. Patient is here with his caregiver Darryl from the Group [facility] today who reports he continues to do very well with his weight and BG control. Weight today is stable at 155 pounds and reported BG ranges are 97 - 109 pre breakfast and 115 - 152 post supper. He is still walking and doing well with his food choices.  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  Estimated energy needs:  1500-1600 calories 170-175 g carbohydrates 115-120 g protein 41-43 g fat  Intervention:  Nutrition reviewed and deemed appropriate at this time.  Discussed causes, symptoms and treatment for hypoglycemia since he is on Glipizide. Provided handout as well.  Continue:  If his glucose at bedtime is at 90 mg, then off him the cheese and crackers.  If the glucose is 110 mg or greater, then he will not need a snack.  Continue to help him to stay active with his walking.  Needs the walking on a daily basis for the glucose control.  Keep up the protein/meats in his diet.  Continue to use lean protein sources.  If he is asking for more foods, try to use the lean proteins rather than a starch or other carb.  If using a protein bar, look for a whey based protein bar.  If he breaks out in a cold sweat or feels shaky, check his blood sugar to see if it is too low (below 70 mg/dl). If so, drink 4 oz fruit juice or regular soda to bring blood sugar up quickly.  F/U in 3 months.  KEEP UP THE GOOD WORK!   Monitoring/Evaluation:  Dietary intake, exercise, blood glucose , and body weight in 3 months.

## 2014-02-24 ENCOUNTER — Ambulatory Visit: Payer: Medicaid Other

## 2014-03-24 ENCOUNTER — Encounter: Payer: Medicaid Other | Attending: Family Medicine | Admitting: *Deleted

## 2014-03-24 VITALS — Ht 65.0 in | Wt 153.5 lb

## 2014-03-24 DIAGNOSIS — Z713 Dietary counseling and surveillance: Secondary | ICD-10-CM | POA: Diagnosis not present

## 2014-03-24 DIAGNOSIS — E119 Type 2 diabetes mellitus without complications: Secondary | ICD-10-CM | POA: Diagnosis present

## 2014-03-24 NOTE — Progress Notes (Signed)
  Medical Nutrition Therapy:  Appt start time: 1530 end time:  1600.  Assessment:  Primary concerns today:03/24/14 for follow up viist for diet, blood glucose and weight control. Patient is here with his caregiver Dennie Bible from the Group Home today who reports he continues to do very well with his weight and BG control. Weight today is stable at 153.5 pounds and reported BG ranges are 90 - 100 pre breakfast and 115 - 165 post supper. They report he is still walking especially in cooler weather and doing well with his food choices.  Pat states A1c has been done recently and reports it was in 6.5 - 7.0% range. This data is not available to me directly.  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  Estimated energy needs:  1500-1600 calories 170-175 g carbohydrates 115-120 g protein 41-43 g fat  Intervention:  Nutrition reviewed and deemed appropriate at this time.  Continue:  If his glucose at bedtime is at 90 mg, then off him the cheese and crackers.  If the glucose is 110 mg or greater, then he will not need a snack.  Continue to help him to stay active with his walking.  Needs the walking on a daily basis for the glucose control.  Keep up the protein/meats in his diet.  Continue to use lean protein sources.  If he is asking for more foods, try to use the lean proteins rather than a starch or other carb.  If he breaks out in a cold sweat or feels shaky, check his blood sugar to see if it is too low (below 70 mg/dl). If so, drink 4 oz fruit juice or regular soda to bring blood sugar up quickly.  F/U in 3 months.  KEEP UP THE GOOD WORK!   Monitoring/Evaluation:  Dietary intake, exercise, blood glucose , and body weight in 3 months.

## 2014-03-25 NOTE — Patient Instructions (Signed)
   If his glucose at bedtime is at 90 mg, then off him the cheese and crackers.  If the glucose is 110 mg or greater, then he will not need a snack.  Continue to help him to stay active with his walking.  Needs the walking on a daily basis for the glucose control.  Keep up the protein/meats in his diet.  Continue to use lean protein sources.  If he is asking for more foods, try to use the lean proteins rather than a starch or other carb.  If he breaks out in a cold sweat or feels shaky, check his blood sugar to see if it is too low (below 70 mg/dl). If so, drink 4 oz fruit juice or regular soda to bring blood sugar up quickly.  F/U in 3 months.  KEEP UP THE GOOD WORK!

## 2014-04-26 ENCOUNTER — Ambulatory Visit (HOSPITAL_BASED_OUTPATIENT_CLINIC_OR_DEPARTMENT_OTHER): Payer: Medicaid Other | Attending: Family Medicine

## 2014-04-26 VITALS — Ht 65.0 in | Wt 153.0 lb

## 2014-04-26 DIAGNOSIS — G4733 Obstructive sleep apnea (adult) (pediatric): Secondary | ICD-10-CM | POA: Insufficient documentation

## 2014-05-09 ENCOUNTER — Ambulatory Visit (HOSPITAL_BASED_OUTPATIENT_CLINIC_OR_DEPARTMENT_OTHER): Payer: Medicaid Other | Admitting: Internal Medicine

## 2014-05-09 DIAGNOSIS — G4733 Obstructive sleep apnea (adult) (pediatric): Secondary | ICD-10-CM

## 2014-05-09 NOTE — Sleep Study (Signed)
   NAME: Ryan Case DATE OF BIRTH:  08-16-1970 MEDICAL RECORD NUMBER [id number]  LOCATION: [facility]  PHYSICIAN: YOUNG,CLINTON D  DATE OF STUDY: 04/26/2014  SLEEP STUDY TYPE: Nocturnal Polysomnogram               REFERRING PHYSICIAN: Leilani Ableeese, Betti D, MD  INDICATION FOR STUDY: Hypersomnia with sleep apnea  EPWORTH SLEEPINESS SCORE:   12/24 HEIGHT: 5\' 5"  (165.1 cm)  WEIGHT: 153 lb (69.4 kg)    Body mass index is 25.46 kg/(m^2).  NECK SIZE: 15 in.  MEDICATIONS: Charted for review  SLEEP ARCHITECTURE: Total sleep time 102.5 minutes with sleep efficiency 28.3%. Stage I was 2.9%, stage II 97.1%, stage III and REM were absent. Sleep latency 172 minutes, awake after sleep onset 87 minutes, arousal index 0, bedtime medication: carvedilol, potassium, risperidone, Lanoxin, furosemide, benztropine, famotidine, lorazepam, glipizide  RESPIRATORY DATA: Apnea hypopnea index (AHI) 4.7 per hour. 8 total events scored including 3 obstructive apneas and 5 hypopneas. Events were more common while nonsupine. There were not enough events to permit split C Pap titration.  OXYGEN DATA: Very loud snoring with oxygen desaturation to a nadir of 88% and mean saturation 94.4% on room air  CARDIAC DATA: Sinus rhythm with PVCs  MOVEMENT/PARASOMNIA: No significant movement disturbance, bathroom 6  IMPRESSION/ RECOMMENDATION:   1) This patient had significant difficulty initiating and maintaining sleep. Sleep onset was not until after 1 AM. He was awake for about an hour around 3 AM and again shortly after 4 AM. He was up 6 times for bathroom. Bedtime medication as noted above. Perhaps medical or urologic assistance with the frequent nocturia would make a significant improvement. 2) Respiratory pattern was within normal, with occasional respiratory events causing sleep disturbance. AHI 4.7 per hour [place] the normal range for adults is an AHI from 0-5 events per hour). Very loud snoring  with oxygen desaturation to a nadir of 88% and mean saturation 94.4% on room air.    Ryan BudgeYOUNG,CLINTON D Diplomate, American Board of Sleep Medicine  ELECTRONICALLY SIGNED ON:  05/09/2014, 10:06 AM [facility] PH: [phone]   FX: [phone] ACCREDITED BY THE AMERICAN ACADEMY OF SLEEP MEDICINE

## 2014-06-23 ENCOUNTER — Encounter: Payer: Medicaid Other | Attending: Family Medicine | Admitting: *Deleted

## 2014-06-23 DIAGNOSIS — Z713 Dietary counseling and surveillance: Secondary | ICD-10-CM | POA: Insufficient documentation

## 2014-06-23 DIAGNOSIS — E119 Type 2 diabetes mellitus without complications: Secondary | ICD-10-CM | POA: Diagnosis present

## 2014-06-23 NOTE — Progress Notes (Signed)
  Medical Nutrition Therapy:  Appt start time: 1530 end time:  1600.  Assessment:  Primary concerns today:06/23/14 for follow up viist for diet, blood glucose and weight control. Patient is here with his caregiver Dennie Bibleat from the Group Home today who reports he continues to do very well with his weight and BG control. Weight today is stable at 150.6 pounds and reported BG ranges continue to be 90 - 100 pre breakfast and 115 - 150 post supper. They report he is still walking especially in cooler weather and doing well with his food choices.  Pat states A1c has been done recently and reports it was in 6.5 - 7.0% range. This data is not available to me directly.  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  Estimated energy needs:  1500-1600 calories 170-175 g carbohydrates 115-120 g protein 41-43 g fat  Intervention:  Nutrition reviewed and deemed appropriate at this time.  Continue:  If his glucose at bedtime is at 90 mg, then off him the cheese and crackers.  If the glucose is 110 mg or greater, then he will not need a snack.  Continue to help him to stay active with his walking.  Needs the walking on a daily basis for the glucose control.  Keep up the protein/meats in his diet.  Continue to use lean protein sources.  If he is asking for more foods, try to use the lean proteins rather than a starch or other carb.  If he breaks out in a cold sweat or feels shaky, check his blood sugar to see if it is too low (below 70 mg/dl). If so, drink 4 oz fruit juice or regular soda to bring blood sugar up quickly.  F/U in 3 months.  KEEP UP THE GOOD WORK!   Monitoring/Evaluation:  Dietary intake, exercise, blood glucose , and body weight in 3 months.

## 2014-07-07 ENCOUNTER — Ambulatory Visit: Payer: Medicaid Other

## 2014-07-07 ENCOUNTER — Ambulatory Visit (INDEPENDENT_AMBULATORY_CARE_PROVIDER_SITE_OTHER): Payer: Medicaid Other

## 2014-07-07 DIAGNOSIS — M79673 Pain in unspecified foot: Secondary | ICD-10-CM

## 2014-07-07 DIAGNOSIS — E1140 Type 2 diabetes mellitus with diabetic neuropathy, unspecified: Secondary | ICD-10-CM

## 2014-07-07 DIAGNOSIS — B351 Tinea unguium: Secondary | ICD-10-CM

## 2014-07-07 NOTE — Progress Notes (Signed)
   Subjective:    Patient ID: Ryan Case, male    DOB: March 05, 1971, 43 y.o.   MRN: [id number]  HPI Pt presents for nail debridement   Review of Systems no new findings or systemic changes noted     Objective:   Physical Exam Neurovascular status unchanged pedal pulses DP +2 PT +1 thready at best bilateral epicritic and proprioceptive sensations decreased on Semmes Weinstein to the forefoot and digits nails thick [place] friable discolored and criptotic no open wounds or ulcers are noted       Assessment & Plan:  Assessment diabetes with history peripheral neuropathy painful mycotic nails debrided 10 return for palliative diabetic foot and mycotic nail care in 3 months for an as-needed basis for follow-up  Alvan Dameichard Yolunda Kloos DPM

## 2014-09-24 ENCOUNTER — Encounter: Payer: Medicaid Other | Attending: Family Medicine | Admitting: *Deleted

## 2014-09-24 VITALS — Ht 65.0 in | Wt 146.7 lb

## 2014-09-24 DIAGNOSIS — Z713 Dietary counseling and surveillance: Secondary | ICD-10-CM | POA: Diagnosis not present

## 2014-09-24 DIAGNOSIS — E119 Type 2 diabetes mellitus without complications: Secondary | ICD-10-CM | POA: Insufficient documentation

## 2014-09-24 NOTE — Progress Notes (Signed)
  Medical Nutrition Therapy:  Appt start time: 1530 end time:  1600.  Assessment:  Primary concerns today:09/24/14 for follow up visit for diet, blood glucose and weight control. Patient is here with his caregiver Ryan NajjarLarry from the [facility] Home today who reports he continues to do very well with his weight and BG control. Weight today is stable at 146.7 pounds and reported BG ranges continue to be 90 - 100 pre breakfast and 115 - 150 post supper. They report he is still walking especially in cooler weather and doing well with his food choices.  Pat states A1c has been done recently and reports it was in 6.5 - 7.0% range. This data is not available to me directly.  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  Estimated energy needs:  1500-1600 calories 170-175 g carbohydrates 115-120 g protein 41-43 g fat  Intervention:  Nutrition reviewed and deemed appropriate at this time.  Continue:  If his glucose at bedtime is at 90 mg, then off him the cheese and crackers.  If the glucose is 110 mg or greater, then he will not need a snack.  Continue to help him to stay active with his walking.  Needs the walking on a daily basis for the glucose control.  Keep up the protein/meats in his diet.  Continue to use lean protein sources.  If he is asking for more foods, try to use the lean proteins rather than a starch or other carb.  If he breaks out in a cold sweat or feels shaky, check his blood sugar to see if it is too low (below 70 mg/dl). If so, drink 4 oz fruit juice or regular soda to bring blood sugar up quickly.  F/U in 3 months.  KEEP UP THE GOOD WORK!   Monitoring/Evaluation:  Dietary intake, exercise, blood glucose , and body weight in 3 months.

## 2014-09-24 NOTE — Patient Instructions (Signed)
   If his glucose at bedtime is at 90 mg, then off him the cheese and crackers.  If the glucose is 110 mg or greater, then he will not need a snack.  Continue to help him to stay active with his walking.  Needs the walking on a daily basis for the glucose control.  Keep up the protein/meats in his diet.  Continue to use lean protein sources.  If he is asking for more foods, try to use the lean proteins rather than a starch or other carb.  If he breaks out in a cold sweat or feels shaky, check his blood sugar to see if it is too low (below 70 mg/dl). If so, drink 4 oz fruit juice or regular soda to bring blood sugar up quickly.  F/U in 3 months.  KEEP UP THE GOOD WORK!

## 2014-10-20 ENCOUNTER — Ambulatory Visit (INDEPENDENT_AMBULATORY_CARE_PROVIDER_SITE_OTHER): Payer: Medicaid Other | Admitting: Podiatry

## 2014-10-20 DIAGNOSIS — E1140 Type 2 diabetes mellitus with diabetic neuropathy, unspecified: Secondary | ICD-10-CM | POA: Diagnosis not present

## 2014-10-20 DIAGNOSIS — M79673 Pain in unspecified foot: Secondary | ICD-10-CM

## 2014-10-20 DIAGNOSIS — B351 Tinea unguium: Secondary | ICD-10-CM

## 2014-10-20 NOTE — Progress Notes (Signed)
Subjective:     Patient ID: Ryan Case, male   DOB: [birth year], 44 y.o.   MRN: [id number]  HPI Presents today chief complaint of painful elongated toenails.  Objective: Pulses are palpable bilateral nails are thick, yellow dystrophic onychomycosis and painful palpation.   Assessment: Onychomycosis with pain in limb.  Plan: Treatment of nails in thickness and length as covered service secondary to pain.   Review of Systems     Objective:   Physical Exam     Assessment:     Pain in limb secondary to onychomycosis..    Plan:     Debrided nails 1 through 5 bilateral.

## 2014-12-31 ENCOUNTER — Ambulatory Visit: Payer: Medicaid Other | Admitting: *Deleted

## 2015-01-14 ENCOUNTER — Encounter: Payer: Self-pay | Admitting: *Deleted

## 2015-01-14 ENCOUNTER — Encounter: Payer: Medicaid Other | Attending: Family Medicine | Admitting: *Deleted

## 2015-01-14 VITALS — Ht 65.0 in | Wt 144.0 lb

## 2015-01-14 DIAGNOSIS — E119 Type 2 diabetes mellitus without complications: Secondary | ICD-10-CM | POA: Insufficient documentation

## 2015-01-14 DIAGNOSIS — Z713 Dietary counseling and surveillance: Secondary | ICD-10-CM | POA: Diagnosis not present

## 2015-01-14 NOTE — Patient Instructions (Signed)
Plan:  Aim for 3 Carb Choices per meal (45 grams) +/- 1 either way  Aim for 0-2 Carbs per snack if hungry  Include protein in moderation with your meals and snacks Continue with your activity level daily as tolerated Continue checking BG at alternate times per day  Continue taking diabetes medication Glyburide as directed by MD KEEP UP THE GOOD WORK!

## 2015-01-14 NOTE — Progress Notes (Signed)
  Medical Nutrition Therapy:  Appt start time: 1600 end time:  1630.  Assessment:  Primary concerns today:01/14/15 for follow up visit for diet, blood glucose and weight control. Patient is here with his caregiver Peyton NajjarLarry from the Group Home today who reports he continues to do very well with his weight and BG control. Weight today is 144.0 pounds which is another 3 pound weight loss and reported BG ranges continue to be 90 - 100 pre breakfast and 115 - 150 post supper. They report he is still getting in some activity every day and he continues to do well with his food choices. Peyton NajjarLarry had some questions about integrating a dessert occasionally?  Pat states A1c has been done recently and reports it was in 6.5 - 7.0% range. This data is not available to me directly.  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  Estimated energy needs:  1500-1600 calories 170-175 g carbohydrates 115-120 g protein 41-43 g fat  Intervention:  Nutrition reviewed and deemed appropriate at this time. I answered questions about the carb content of common desserts and how they could be considered as part of his Carb Choices at a meal by eating more vegetables and meat with no starch at that meal. Good verbal understanding expressed by the caregiver, Peyton NajjarLarry.  I have suggested no further weight loss at this time. His BMI is currently at 24. Also reviewed action of Glyburide and potential for hypoglycemia. Discussed causes, symptoms and treatment for BG below 80 mg/dl.  Plan:  Aim for 3 Carb Choices per meal (45 grams) +/- 1 either way  Aim for 0-2 Carbs per snack if hungry  Include protein in moderation with your meals and snacks Continue with your activity level daily as tolerated Continue checking BG at alternate times per day  Continue taking diabetes medication Glyburide as directed by MD KEEP UP THE GOOD WORK!    Handouts provided today: Carb Counting Handout Yellow Meal Plan Card  Monitoring/Evaluation:  Dietary intake, exercise, blood glucose , and body weight in 3 months.

## 2015-01-28 ENCOUNTER — Ambulatory Visit (INDEPENDENT_AMBULATORY_CARE_PROVIDER_SITE_OTHER): Payer: Medicaid Other | Admitting: Podiatry

## 2015-01-28 ENCOUNTER — Encounter: Payer: Self-pay | Admitting: Podiatry

## 2015-01-28 DIAGNOSIS — M79674 Pain in right toe(s): Secondary | ICD-10-CM | POA: Diagnosis not present

## 2015-01-28 DIAGNOSIS — M79675 Pain in left toe(s): Secondary | ICD-10-CM | POA: Diagnosis not present

## 2015-01-28 DIAGNOSIS — E1140 Type 2 diabetes mellitus with diabetic neuropathy, unspecified: Secondary | ICD-10-CM

## 2015-01-28 DIAGNOSIS — B351 Tinea unguium: Secondary | ICD-10-CM | POA: Diagnosis not present

## 2015-01-28 DIAGNOSIS — M79673 Pain in unspecified foot: Secondary | ICD-10-CM

## 2015-01-28 NOTE — Progress Notes (Signed)
Patient ID: Ryan Case, male   DOB: 02/01/1971, 44 y.o.   MRN: [id number] Complaint:  Visit Type: Patient returns to my office for continued preventative foot care services. Complaint: Patient states" my nails have grown long and thick and become painful to walk and wear shoes" Patient has been diagnosed with DM with neuropathy.. The patient presents for preventative foot care services. No changes to ROS  Podiatric Exam: Vascular: dorsalis pedis and posterior tibial pulses are palpable bilateral. Capillary return is immediate. Temperature gradient is WNL. Skin turgor WNL  Sensorium: Normal Semmes Weinstein monofilament test. Normal tactile sensation bilaterally. Nail Exam: Pt has thick disfigured discolored nails with subungual debris noted bilateral entire nail hallux through fifth toenails Ulcer Exam: There is no evidence of ulcer or pre-ulcerative changes or infection. Orthopedic Exam: Muscle tone and strength are WNL. No limitations in general ROM. No crepitus or effusions noted. Foot type and digits show no abnormalities. Bony prominences are unremarkable. Skin: No Porokeratosis. No infection or ulcers  Diagnosis:  Onychomycosis, , Pain in right toe, pain in left toes  Treatment & Plan Procedures and Treatment: Consent by patient was obtained for treatment procedures. The patient understood the discussion of treatment and procedures well. All questions were answered thoroughly reviewed. Debridement of mycotic and hypertrophic toenails, 1 through 5 bilateral and clearing of subungual debris. No ulceration, no infection noted.  Return Visit-Office Procedure: Patient instructed to return to the office for a follow up visit 3 months for continued evaluation and treatment. 

## 2015-04-22 ENCOUNTER — Ambulatory Visit: Payer: Medicaid Other | Admitting: *Deleted

## 2015-04-29 ENCOUNTER — Encounter: Payer: Medicaid Other | Attending: Family Medicine | Admitting: *Deleted

## 2015-04-29 ENCOUNTER — Ambulatory Visit: Payer: Medicaid Other | Admitting: *Deleted

## 2015-04-29 VITALS — Ht 65.0 in | Wt 143.2 lb

## 2015-04-29 DIAGNOSIS — E119 Type 2 diabetes mellitus without complications: Secondary | ICD-10-CM | POA: Diagnosis present

## 2015-04-29 DIAGNOSIS — Z713 Dietary counseling and surveillance: Secondary | ICD-10-CM | POA: Diagnosis not present

## 2015-05-04 ENCOUNTER — Ambulatory Visit (INDEPENDENT_AMBULATORY_CARE_PROVIDER_SITE_OTHER): Payer: Medicare Other | Admitting: Podiatry

## 2015-05-04 ENCOUNTER — Encounter: Payer: Self-pay | Admitting: Podiatry

## 2015-05-04 DIAGNOSIS — E1140 Type 2 diabetes mellitus with diabetic neuropathy, unspecified: Secondary | ICD-10-CM

## 2015-05-04 DIAGNOSIS — B351 Tinea unguium: Secondary | ICD-10-CM

## 2015-05-04 DIAGNOSIS — M79673 Pain in unspecified foot: Secondary | ICD-10-CM | POA: Diagnosis not present

## 2015-05-04 NOTE — Progress Notes (Signed)
Patient ID: Ryan Case, male   DOB: 11/20/1970, 44 y.o.   MRN: [id number] Complaint:  Visit Type: Patient returns to my office for continued preventative foot care services. Complaint: Patient states" my nails have grown long and thick and become painful to walk and wear shoes" Patient has been diagnosed with DM with neuropathy.. The patient presents for preventative foot care services. No changes to ROS  Podiatric Exam: Vascular: dorsalis pedis and posterior tibial pulses are palpable bilateral. Capillary return is immediate. Temperature gradient is WNL. Skin turgor WNL  Sensorium: Normal Semmes Weinstein monofilament test. Normal tactile sensation bilaterally. Nail Exam: Pt has thick disfigured discolored nails with subungual debris noted bilateral entire nail hallux through fifth toenails Ulcer Exam: There is no evidence of ulcer or pre-ulcerative changes or infection. Orthopedic Exam: Muscle tone and strength are WNL. No limitations in general ROM. No crepitus or effusions noted. Foot type and digits show no abnormalities. Bony prominences are unremarkable. Skin: No Porokeratosis. No infection or ulcers  Diagnosis:  Onychomycosis, , Pain in right toe, pain in left toes  Treatment & Plan Procedures and Treatment: Consent by patient was obtained for treatment procedures. The patient understood the discussion of treatment and procedures well. All questions were answered thoroughly reviewed. Debridement of mycotic and hypertrophic toenails, 1 through 5 bilateral and clearing of subungual debris. No ulceration, no infection noted.  Return Visit-Office Procedure: Patient instructed to return to the office for a follow up visit 3 months for continued evaluation and treatment.

## 2015-05-07 ENCOUNTER — Encounter: Payer: Self-pay | Admitting: *Deleted

## 2015-05-07 NOTE — Progress Notes (Signed)
  Medical Nutrition Therapy:  Appt start time: 0930 end time:  1000.  Assessment:  Primary concerns today:04/29/15 for follow up visit for diet, blood glucose and weight control. Patient is here with his caregiver Ryan Case from the Group [facility] today who again reports he continues to do very well with his weight and BG control. Weight today is 143.2 pounds which is another 1 pound weight loss and reported BG ranges continue to be 110 - 150 pre and post meals. They report he is still getting in some activity every day and he continues to do well with his food choices. No questions or concerns at this visit  Pat states A1c has been done recently and reports it was in 6.5 - 7.0% range. This data is not available to me directly.  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  Estimated energy needs:  1500-1600 calories 170-175 g carbohydrates 115-120 g protein 41-43 g fat  Intervention:  Nutrition reviewed and deemed appropriate at this time.  I have suggested no further weight loss at this time. His BMI is currently at 23.9.  Plan:  Aim for 3 Carb Choices per meal (45 grams) +/- 1 either way  Aim for 0-2 Carbs per snack if hungry  Include protein in moderation with your meals and snacks Continue with your activity level daily as tolerated Continue checking BG at alternate times per day  Continue taking diabetes medication Glyburide as directed by MD KEEP UP THE GOOD WORK!   Handouts provided today: No new handouts today  Monitoring/Evaluation:  Dietary intake, exercise, blood glucose , and body weight PRN. Follow up not required unless patient's family requests it.

## 2015-05-07 NOTE — Patient Instructions (Signed)
Plan:  Aim for 3 Carb Choices per meal (45 grams) +/- 1 either way  Aim for 0-2 Carbs per snack if hungry  Include protein in moderation with your meals and snacks Continue with your activity level daily as tolerated Continue checking BG at alternate times per day  Continue taking diabetes medication Glyburide as directed by MD KEEP UP THE GOOD WORK!   

## 2015-08-10 ENCOUNTER — Encounter: Payer: Self-pay | Admitting: Podiatry

## 2015-08-10 ENCOUNTER — Ambulatory Visit (INDEPENDENT_AMBULATORY_CARE_PROVIDER_SITE_OTHER): Payer: Medicaid Other | Admitting: Podiatry

## 2015-08-10 DIAGNOSIS — B351 Tinea unguium: Secondary | ICD-10-CM

## 2015-08-10 DIAGNOSIS — E1140 Type 2 diabetes mellitus with diabetic neuropathy, unspecified: Secondary | ICD-10-CM

## 2015-08-10 DIAGNOSIS — M79673 Pain in unspecified foot: Secondary | ICD-10-CM | POA: Diagnosis not present

## 2015-08-10 NOTE — Progress Notes (Signed)
Patient ID: Ryan Case, male   DOB: 08-18-1970, 45 y.o.   MRN: [id number] Complaint:  Visit Type: Patient returns to my office for continued preventative foot care services. Complaint: Patient states" my nails have grown long and thick and become painful to walk and wear shoes" Patient has been diagnosed with DM with neuropathy.. The patient presents for preventative foot care services. No changes to ROS  Podiatric Exam: Vascular: dorsalis pedis and posterior tibial pulses are palpable bilateral. Capillary return is immediate. Temperature gradient is WNL. Skin turgor WNL  Sensorium: Normal Semmes Weinstein monofilament test. Normal tactile sensation bilaterally. Nail Exam: Pt has thick disfigured discolored nails with subungual debris noted bilateral entire nail hallux through fifth toenails Ulcer Exam: There is no evidence of ulcer or pre-ulcerative changes or infection. Orthopedic Exam: Muscle tone and strength are WNL. No limitations in general ROM. No crepitus or effusions noted. Foot type and digits show no abnormalities. Bony prominences are unremarkable. Skin: No Porokeratosis. No infection or ulcers  Diagnosis:  Onychomycosis, , Pain in right toe, pain in left toes  Treatment & Plan Procedures and Treatment: Consent by patient was obtained for treatment procedures. The patient understood the discussion of treatment and procedures well. All questions were answered thoroughly reviewed. Debridement of mycotic and hypertrophic toenails, 1 through 5 bilateral and clearing of subungual debris. No ulceration, no infection noted.  Return Visit-Office Procedure: Patient instructed to return to the office for a follow up visit 3 months for continued evaluation and treatment.    Helane Gunther DPM

## 2015-11-09 ENCOUNTER — Ambulatory Visit (INDEPENDENT_AMBULATORY_CARE_PROVIDER_SITE_OTHER): Payer: Medicare Other | Admitting: Podiatry

## 2015-11-09 DIAGNOSIS — B351 Tinea unguium: Secondary | ICD-10-CM | POA: Diagnosis not present

## 2015-11-09 DIAGNOSIS — M79673 Pain in unspecified foot: Secondary | ICD-10-CM

## 2015-11-09 DIAGNOSIS — E1140 Type 2 diabetes mellitus with diabetic neuropathy, unspecified: Secondary | ICD-10-CM | POA: Diagnosis not present

## 2015-11-09 NOTE — Progress Notes (Signed)
Patient ID: Ryan Case, male   DOB: 09-07-1970, 45 y.o.   MRN: [id number] Complaint:  Visit Type: Patient returns to my office for continued preventative foot care services. Complaint: Patient states" my nails have grown long and thick and become painful to walk and wear shoes" Patient has been diagnosed with DM with neuropathy.. The patient presents for preventative foot care services. No changes to ROS  Podiatric Exam: Vascular: dorsalis pedis and posterior tibial pulses are palpable bilateral. Capillary return is immediate. Temperature gradient is WNL. Skin turgor WNL  Sensorium: Normal Semmes Weinstein monofilament test. Normal tactile sensation bilaterally. Nail Exam: Pt has thick disfigured discolored nails with subungual debris noted bilateral entire nail hallux through fifth toenails Ulcer Exam: There is no evidence of ulcer or pre-ulcerative changes or infection. Orthopedic Exam: Muscle tone and strength are WNL. No limitations in general ROM. No crepitus or effusions noted. Foot type and digits show no abnormalities. Bony prominences are unremarkable. Skin: No Porokeratosis. No infection or ulcers  Diagnosis:  Onychomycosis, , Pain in right toe, pain in left toes  Treatment & Plan Procedures and Treatment: Consent by patient was obtained for treatment procedures. The patient understood the discussion of treatment and procedures well. All questions were answered thoroughly reviewed. Debridement of mycotic and hypertrophic toenails, 1 through 5 bilateral and clearing of subungual debris. No ulceration, no infection noted. Non healing skin lesion dorsum left foot.  Patient self injures. Return Visit-Office Procedure: Patient instructed to return to the office for a follow up visit 3 months for continued evaluation and treatment.    Ryan Case DPM

## 2016-02-08 ENCOUNTER — Ambulatory Visit: Payer: Medicare Other | Admitting: Podiatry

## 2016-03-07 ENCOUNTER — Ambulatory Visit: Payer: Medicare Other | Admitting: Podiatry

## 2016-03-24 ENCOUNTER — Ambulatory Visit: Payer: Medicare Other | Admitting: Podiatry

## 2016-04-18 ENCOUNTER — Encounter: Payer: Self-pay | Admitting: Podiatry

## 2016-04-18 ENCOUNTER — Ambulatory Visit (INDEPENDENT_AMBULATORY_CARE_PROVIDER_SITE_OTHER): Payer: Medicare Other | Admitting: Podiatry

## 2016-04-18 DIAGNOSIS — B351 Tinea unguium: Secondary | ICD-10-CM

## 2016-04-18 DIAGNOSIS — E1140 Type 2 diabetes mellitus with diabetic neuropathy, unspecified: Secondary | ICD-10-CM | POA: Diagnosis not present

## 2016-04-18 DIAGNOSIS — M79676 Pain in unspecified toe(s): Secondary | ICD-10-CM | POA: Diagnosis not present

## 2016-04-18 NOTE — Progress Notes (Signed)
Patient ID: Ryan Case, male   DOB: 11/15/1970, 45 y.o.   MRN: [id number] Complaint:  Visit Type: Patient returns to my office for continued preventative foot care services. Complaint: Patient states" my nails have grown long and thick and become painful to walk and wear shoes" Patient has been diagnosed with DM with neuropathy.. The patient presents for preventative foot care services. No changes to ROS  Podiatric Exam: Vascular: dorsalis pedis and posterior tibial pulses are palpable bilateral. Capillary return is immediate. Temperature gradient is WNL. Skin turgor WNL  Sensorium: Normal Semmes Weinstein monofilament test. Normal tactile sensation bilaterally. Nail Exam: Pt has thick disfigured discolored nails with subungual debris noted bilateral entire nail hallux through fifth toenails Ulcer Exam: There is no evidence of ulcer or pre-ulcerative changes or infection. Orthopedic Exam: Muscle tone and strength are WNL. No limitations in general ROM. No crepitus or effusions noted. Foot type and digits show no abnormalities. Bony prominences are unremarkable. Skin: No Porokeratosis. No infection or ulcers  Diagnosis:  Onychomycosis, , Pain in right toe, pain in left toes  Treatment & Plan Procedures and Treatment: Consent by patient was obtained for treatment procedures. The patient understood the discussion of treatment and procedures well. All questions were answered thoroughly reviewed. Debridement of mycotic and hypertrophic toenails, 1 through 5 bilateral and clearing of subungual debris. No ulceration, no infection noted. Absent nail fifth toe right foot.  Patient self injures. Return Visit-Office Procedure: Patient instructed to return to the office for a follow up visit 3 months for continued evaluation and treatment.    Helane GuntherGregory Buell Parcel DPM

## 2016-07-21 ENCOUNTER — Ambulatory Visit: Payer: Medicare Other | Admitting: Podiatry

## 2016-07-28 ENCOUNTER — Ambulatory Visit: Payer: Medicare Other | Admitting: Podiatry

## 2016-08-15 ENCOUNTER — Ambulatory Visit (INDEPENDENT_AMBULATORY_CARE_PROVIDER_SITE_OTHER): Payer: Medicare Other | Admitting: Podiatry

## 2016-08-15 ENCOUNTER — Encounter: Payer: Self-pay | Admitting: Podiatry

## 2016-08-15 DIAGNOSIS — E1140 Type 2 diabetes mellitus with diabetic neuropathy, unspecified: Secondary | ICD-10-CM

## 2016-08-15 DIAGNOSIS — B351 Tinea unguium: Secondary | ICD-10-CM | POA: Diagnosis not present

## 2016-08-15 NOTE — Progress Notes (Signed)
Patient ID: Ryan Case, male   DOB: February 08, 1971, 46 y.o.   MRN: [id number]    Subjective: This patient presents with a representative from his group home present in the treatment room. Patient presents for periodic debridement of uncomfortable toenails at approximately three-month intervals Patient is a diabetic with a history of neuropathy History of shoe rub on the dorsal aspect of the left foot per patient's representative with unknown topical medication applied to this area. The representative states that they changed his shoes which has reduced irritation  Objective: Patient is put a pleasant and has difficulty responding to direct questioning DP and PT pulses 2/4 bilaterally Capillary reflex present bilaterally Patient has difficulty responding to 10 g monofilament wire Patient has difficulty responding vibratory sensation Ankle reflexes reactive bilaterally The dorsal base of first metatarsocuneiform is prominent with some slightly scaling erythematous skin without open lesion. 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

## 2016-08-15 NOTE — Patient Instructions (Signed)
Apply triple antibiotic ointment to the irritated area on the top of the left foot daily and cover with a Band-Aid until healed Wear a loose fitting shoe to avoid pressure on the tops of the feet If you notice any sudden increase in pain, swelling, redness, drainage, fever present to the emergency department  Ryan Case C Tuchman DPM [facility] 08/15/2016    Diabetes and Foot Care Diabetes may cause you to have problems because of poor blood supply (circulation) to your feet and legs. This may cause the skin on your feet to become thinner, break easier, and heal more slowly. Your skin may become dry, and the skin may peel and crack. You may also have nerve damage in your legs and feet causing decreased feeling in them. You may not notice minor injuries to your feet that could lead to infections or more serious problems. Taking care of your feet is one of the most important things you can do for yourself. Follow these instructions at home:  Wear shoes at all times, even in the house. Do not go barefoot. Bare feet are easily injured.  Check your feet daily for blisters, cuts, and redness. If you cannot see the bottom of your feet, use a mirror or ask someone for help.  Wash your feet with warm water (do not use hot water) and mild soap. Then pat your feet and the areas between your toes until they are completely dry. Do not soak your feet as this can dry your skin.  Apply a moisturizing lotion or petroleum jelly (that does not contain alcohol and is unscented) to the skin on your feet and to dry, brittle toenails. Do not apply lotion between your toes.  Trim your toenails straight across. Do not dig under them or around the cuticle. File the edges of your nails with an emery board or nail file.  Do not cut corns or calluses or try to remove them with medicine.  Wear clean socks or stockings every day. Make sure they are not too tight. Do not wear knee-high stockings since they may  decrease blood flow to your legs.  Wear shoes that fit properly and have enough cushioning. To break in new shoes, wear them for just a few hours a day. This prevents you from injuring your feet. Always look in your shoes before you put them on to be sure there are no objects inside.  Do not cross your legs. This may decrease the blood flow to your feet.  If you find a minor scrape, cut, or break in the skin on your feet, keep it and the skin around it clean and dry. These areas may be cleansed with mild soap and water. Do not cleanse the area with peroxide, alcohol, or iodine.  When you remove an adhesive bandage, be sure not to damage the skin around it.  If you have a wound, look at it several times a day to make sure it is healing.  Do not use heating pads or hot water bottles. They may burn your skin. If you have lost feeling in your feet or legs, you may not know it is happening until it is too late.  Make sure your health care provider performs a complete foot exam at least annually or more often if you have foot problems. Report any cuts, sores, or bruises to your health care provider immediately. Contact a health care provider if:  You have an injury that is not healing.  You  have cuts or breaks in the skin.  You have an ingrown nail.  You notice redness on your legs or feet.  You feel burning or tingling in your legs or feet.  You have pain or cramps in your legs and feet.  Your legs or feet are numb.  Your feet always feel cold. Get help right away if:  There is increasing redness, swelling, or pain in or around a wound.  There is a red line that goes up your leg.  Pus is coming from a wound.  You develop a fever or as directed by your health care provider.  You notice a bad smell coming from an ulcer or wound. This information is not intended to replace advice given to you by your health care provider. Make sure you discuss any questions you have with your  health care provider. Document Released: 06/23/2000 Document Revised: 12/02/2015 Document Reviewed: 12/03/2012 Elsevier Interactive Patient Education  2017 [employer].

## 2016-11-14 ENCOUNTER — Ambulatory Visit (INDEPENDENT_AMBULATORY_CARE_PROVIDER_SITE_OTHER): Payer: Medicare Other | Admitting: Podiatry

## 2016-11-14 DIAGNOSIS — E1140 Type 2 diabetes mellitus with diabetic neuropathy, unspecified: Secondary | ICD-10-CM | POA: Diagnosis not present

## 2016-11-14 DIAGNOSIS — B351 Tinea unguium: Secondary | ICD-10-CM | POA: Diagnosis not present

## 2016-11-14 NOTE — Patient Instructions (Signed)
On the left shoe on cross the shoe laces at press on the prominent bony area on the top of the foot Return every 3 months for debridement of mycotic toenails Ryan Case DPM [facility] 11/14/2016  Diabetes and Foot Care Diabetes may cause you to have problems because of poor blood supply (circulation) to your feet and legs. This may cause the skin on your feet to become thinner, break easier, and heal more slowly. Your skin may become dry, and the skin may peel and crack. You may also have nerve damage in your legs and feet causing decreased feeling in them. You may not notice minor injuries to your feet that could lead to infections or more serious problems. Taking care of your feet is one of the most important things you can do for yourself. Follow these instructions at home:  Wear shoes at all times, even in the house. Do not go barefoot. Bare feet are easily injured.  Check your feet daily for blisters, cuts, and redness. If you cannot see the bottom of your feet, use a mirror or ask someone for help.  Wash your feet with warm water (do not use hot water) and mild soap. Then pat your feet and the areas between your toes until they are completely dry. Do not soak your feet as this can dry your skin.  Apply a moisturizing lotion or petroleum jelly (that does not contain alcohol and is unscented) to the skin on your feet and to dry, brittle toenails. Do not apply lotion between your toes.  Trim your toenails straight across. Do not dig under them or around the cuticle. File the edges of your nails with an emery board or nail file.  Do not cut corns or calluses or try to remove them with medicine.  Wear clean socks or stockings every day. Make sure they are not too tight. Do not wear knee-high stockings since they may decrease blood flow to your legs.  Wear shoes that fit properly and have enough cushioning. To break in new shoes, wear them for just a few hours a day. This  prevents you from injuring your feet. Always look in your shoes before you put them on to be sure there are no objects inside.  Do not cross your legs. This may decrease the blood flow to your feet.  If you find a minor scrape, cut, or break in the skin on your feet, keep it and the skin around it clean and dry. These areas may be cleansed with mild soap and water. Do not cleanse the area with peroxide, alcohol, or iodine.  When you remove an adhesive bandage, be sure not to damage the skin around it.  If you have a wound, look at it several times a day to make sure it is healing.  Do not use heating pads or hot water bottles. They may burn your skin. If you have lost feeling in your feet or legs, you may not know it is happening until it is too late.  Make sure your health care provider performs a complete foot exam at least annually or more often if you have foot problems. Report any cuts, sores, or bruises to your health care provider immediately. Contact a health care provider if:  You have an injury that is not healing.  You have cuts or breaks in the skin.  You have an ingrown nail.  You notice redness on your legs or feet.  You feel  burning or tingling in your legs or feet.  You have pain or cramps in your legs and feet.  Your legs or feet are numb.  Your feet always feel cold. Get help right away if:  There is increasing redness, swelling, or pain in or around a wound.  There is a red line that goes up your leg.  Pus is coming from a wound.  You develop a fever or as directed by your health care provider.  You notice a bad smell coming from an ulcer or wound. This information is not intended to replace advice given to you by your health care provider. Make sure you discuss any questions you have with your health care provider. Document Released: 06/23/2000 Document Revised: 12/02/2015 Document Reviewed: 12/03/2012 Elsevier Interactive Patient Education  2017  [employer].

## 2016-11-14 NOTE — Progress Notes (Signed)
Patient ID: Ryan Case, male   DOB: 11/30/1970, 46 y.o.   MRN: [id number]    Subjective: This patient presents with a representative from his group home present in the treatment room. Patient presents for periodic debridement of uncomfortable toenails at approximately three-month intervals Patient is a diabetic with a history of neuropathy History of shoe rub on the dorsal aspect of the left foot per patient's representative with unknown topical medication applied to this area. The representative states that they changed his shoes which has reduced irritation  Objective: Patient is put a pleasant and has difficulty responding to direct questioning DP and PT pulses 2/4 bilaterally Capillary reflex present bilaterally Patient has difficulty responding to 10 g monofilament wire Patient has difficulty responding vibratory sensation Ankle reflexes reactive bilaterally The dorsal base of first metatarsocuneiform is prominent with some slightly scaling erythematous skin without open lesion. There is no drainage, warmth from this area The toenails are elongated and brittle and deformed 6-10 Pes planus bilaterally Hallux malleus left  Assessment: Satisfactory vascular status Difficulty in evaluating sensation because of difficulty responding Mildly inflamed skin dorsal left first MPJ without clinical sign of infection Symptomatic mycotic toenails 6-10  Plan: Debridement toenails 6-10 mechanically electronically without any bleeding Instructed patient's caregiver to on lace shoes over inflamed dorsal left foot  Reappoint 3 months

## 2017-02-14 ENCOUNTER — Ambulatory Visit (INDEPENDENT_AMBULATORY_CARE_PROVIDER_SITE_OTHER): Payer: Medicare Other | Admitting: Podiatry

## 2017-02-14 NOTE — Progress Notes (Signed)
Erroneous encounter no show  

## 2017-06-11 ENCOUNTER — Encounter: Payer: Self-pay | Admitting: Podiatry

## 2017-06-11 ENCOUNTER — Ambulatory Visit (INDEPENDENT_AMBULATORY_CARE_PROVIDER_SITE_OTHER): Payer: Medicare Other | Admitting: Podiatry

## 2017-06-11 DIAGNOSIS — B351 Tinea unguium: Secondary | ICD-10-CM

## 2017-06-11 DIAGNOSIS — E1140 Type 2 diabetes mellitus with diabetic neuropathy, unspecified: Secondary | ICD-10-CM

## 2017-06-11 NOTE — Progress Notes (Signed)
Patient ID: Ryan Case, male   DOB: January 15, 1971, 46 y.o.   MRN: [id number]   Subjective: This patient presents with a representative from his group home present in the treatment room. Patient presents for periodic debridement of uncomfortable toenails at approximately three-month intervals Patient is a diabetic with a history of neuropathy History of shoe rub on the dorsal aspect of the left foot per patient's representative with unknown topical medication applied to this area. The representative states that they changed his shoes which has reduced irritation  Objective: Patient is put a pleasant and has difficulty responding to direct questioning DP and PT pulses 2/4 bilaterally Capillary reflex present bilaterally Patient has difficulty responding to 10 g monofilament wire Patient has difficulty responding vibratory sensation Ankle reflexes reactive bilaterally The dorsal base of first metatarsocuneiform is prominent with some slightly scaling erythematous skin without open lesion. There is no drainage, warmth from this area The toenails are elongated and brittle and deformed 6-10 Pes planus bilaterally Hallux malleus left  Assessment: Satisfactory vascular status Difficulty in evaluating sensation because of difficulty responding Mildly inflamed skin dorsal left first MPJ without clinical sign of infection Symptomatic mycotic toenails 6-10  Plan: Debridement toenails 6-10 mechanically electronically without any bleeding Instructed patient's caregiver to on lace shoes over inflamed dorsal left foot  Reappoint 4-6 months prn

## 2017-06-11 NOTE — Patient Instructions (Addendum)
Return as needed for toenail debridement at approximately 4- 6 month intervals [name] C Tyjon Bowen DPM [facility] 06/11/2017  Diabetes and Foot Care Diabetes may cause you to have problems because of poor blood supply (circulation) to your feet and legs. This may cause the skin on your feet to become thinner, break easier, and heal more slowly. Your skin may become dry, and the skin may peel and crack. You may also have nerve damage in your legs and feet causing decreased feeling in them. You may not notice minor injuries to your feet that could lead to infections or more serious problems. Taking care of your feet is one of the most important things you can do for yourself. Follow these instructions at home:  Wear shoes at all times, even in the house. Do not go barefoot. Bare feet are easily injured.  Check your feet daily for blisters, cuts, and redness. If you cannot see the bottom of your feet, use a mirror or ask someone for help.  Wash your feet with warm water (do not use hot water) and mild soap. Then pat your feet and the areas between your toes until they are completely dry. Do not soak your feet as this can dry your skin.  Apply a moisturizing lotion or petroleum jelly (that does not contain alcohol and is unscented) to the skin on your feet and to dry, brittle toenails. Do not apply lotion between your toes.  Trim your toenails straight across. Do not dig under them or around the cuticle. File the edges of your nails with an emery board or nail file.  Do not cut corns or calluses or try to remove them with medicine.  Wear clean socks or stockings every day. Make sure they are not too tight. Do not wear knee-high stockings since they may decrease blood flow to your legs.  Wear shoes that fit properly and have enough cushioning. To break in new shoes, wear them for just a few hours a day. This prevents you from injuring your feet. Always look in your shoes before you put  them on to be sure there are no objects inside.  Do not cross your legs. This may decrease the blood flow to your feet.  If you find a minor scrape, cut, or break in the skin on your feet, keep it and the skin around it clean and dry. These areas may be cleansed with mild soap and water. Do not cleanse the area with peroxide, alcohol, or iodine.  When you remove an adhesive bandage, be sure not to damage the skin around it.  If you have a wound, look at it several times a day to make sure it is healing.  Do not use heating pads or hot water bottles. They may burn your skin. If you have lost feeling in your feet or legs, you may not know it is happening until it is too late.  Make sure your health care provider performs a complete foot exam at least annually or more often if you have foot problems. Report any cuts, sores, or bruises to your health care provider immediately. Contact a health care provider if:  You have an injury that is not healing.  You have cuts or breaks in the skin.  You have an ingrown nail.  You notice redness on your legs or feet.  You feel burning or tingling in your legs or feet.  You have pain or cramps in your legs and feet.  Your legs or feet are numb.  Your feet always feel cold. Get help right away if:  There is increasing redness, swelling, or pain in or around a wound.  There is a red line that goes up your leg.  Pus is coming from a wound.  You develop a fever or as directed by your health care provider.  You notice a bad smell coming from an ulcer or wound. This information is not intended to replace advice given to you by your health care provider. Make sure you discuss any questions you have with your health care provider. Document Released: 06/23/2000 Document Revised: 12/02/2015 Document Reviewed: 12/03/2012 Elsevier Interactive Patient Education  2017 [employer].

## 2017-07-13 ENCOUNTER — Encounter: Payer: Self-pay | Admitting: Podiatry

## 2017-07-13 ENCOUNTER — Ambulatory Visit (INDEPENDENT_AMBULATORY_CARE_PROVIDER_SITE_OTHER): Payer: Medicare Other | Admitting: Podiatry

## 2017-07-13 DIAGNOSIS — B353 Tinea pedis: Secondary | ICD-10-CM

## 2017-07-13 DIAGNOSIS — E1140 Type 2 diabetes mellitus with diabetic neuropathy, unspecified: Secondary | ICD-10-CM

## 2017-07-13 DIAGNOSIS — B351 Tinea unguium: Secondary | ICD-10-CM

## 2017-07-14 ENCOUNTER — Encounter: Payer: Self-pay | Admitting: Podiatry

## 2017-07-14 NOTE — Progress Notes (Signed)
This patient presents to the office for an evaluation of his feet.  He says he was seen in this office approximately 4 weeks ago for nail care.  He is brought to the office by a guardian from his home.  The guardian states that he needs to have an order for Tinactin spray discontinued in writing.  This order was put in by Dr. Ralene CorkSikora years ago.  General Appearance  Alert, conversant and in no acute stress.  Vascular  Dorsalis pedis and posterior pulses are palpable  bilaterally.  Capillary return is within normal limits  bilaterally. Temperature is within normal limits  Bilaterally.  Neurologic  Senn-Weinstein monofilament test deferred. Muscle power within normal limits bilaterally.  Nails Thick disfigured discolored nails with subungual debris bilaterally from hallux to fifth toes bilaterally. No evidence of bacterial infection or drainage bilaterally.  Orthopedic  No limitations of motion of motion feet bilaterally.  No crepitus or effusions noted.  No bony pathology or digital deformities noted.  Skin  normotropic skin with no porokeratosis noted bilaterally.  No signs of infections or ulcers noted.   Onychomycosis  B/L   ROV  Discontinue Tinactin spray.  Return to clinic on his  previously scheduled visit    Helane GuntherGregory Koda Routon DPM

## 2018-02-06 ENCOUNTER — Encounter

## 2018-02-21 ENCOUNTER — Ambulatory Visit: Payer: Medicaid Other | Attending: Family Medicine | Admitting: Audiology

## 2018-07-01 ENCOUNTER — Ambulatory Visit: Payer: Medicare Other | Admitting: Podiatry

## 2018-07-08 ENCOUNTER — Ambulatory Visit: Payer: Medicare Other | Admitting: Podiatry

## 2018-07-24 ENCOUNTER — Ambulatory Visit (INDEPENDENT_AMBULATORY_CARE_PROVIDER_SITE_OTHER): Payer: Medicare Other | Admitting: Podiatry

## 2018-07-24 DIAGNOSIS — S91209A Unspecified open wound of unspecified toe(s) with damage to nail, initial encounter: Secondary | ICD-10-CM | POA: Diagnosis not present

## 2018-07-24 DIAGNOSIS — M79675 Pain in left toe(s): Secondary | ICD-10-CM | POA: Diagnosis not present

## 2018-07-24 DIAGNOSIS — M79674 Pain in right toe(s): Secondary | ICD-10-CM | POA: Diagnosis not present

## 2018-07-24 DIAGNOSIS — B351 Tinea unguium: Secondary | ICD-10-CM | POA: Diagnosis not present

## 2018-07-24 NOTE — Patient Instructions (Addendum)
APPLY OINTMENT TO RIGHT 3RD DIGIT ONCE DAILY FOR ONE WEEK   Diabetes Mellitus and Foot Care Foot care is an important part of your health, especially when you have diabetes. Diabetes may cause you to have problems because of poor blood flow (circulation) to your feet and legs, which can cause your skin to:  Become thinner and drier.  Break more easily.  Heal more slowly.  Peel and crack. You may also have nerve damage (neuropathy) in your legs and feet, causing decreased feeling in them. This means that you may not notice minor injuries to your feet that could lead to more serious problems. Noticing and addressing any potential problems early is the best way to prevent future foot problems. How to care for your feet Foot hygiene  Wash your feet daily with warm water and mild soap. Do not use hot water. Then, pat your feet and the areas between your toes until they are completely dry. Do not soak your feet as this can dry your skin.  Trim your toenails straight across. Do not dig under them or around the cuticle. File the edges of your nails with an emery board or nail file.  Apply a moisturizing lotion or petroleum jelly to the skin on your feet and to dry, brittle toenails. Use lotion that does not contain alcohol and is unscented. Do not apply lotion between your toes. Shoes and socks  Wear clean socks or stockings every day. Make sure they are not too tight. Do not wear knee-high stockings since they may decrease blood flow to your legs.  Wear shoes that fit properly and have enough cushioning. Always look in your shoes before you put them on to be sure there are no objects inside.  To break in new shoes, wear them for just a few hours a day. This prevents injuries on your feet. Wounds, scrapes, corns, and calluses  Check your feet daily for blisters, cuts, bruises, sores, and redness. If you cannot see the bottom of your feet, use a mirror or ask someone for help.  Do not cut  corns or calluses or try to remove them with medicine.  If you find a minor scrape, cut, or break in the skin on your feet, keep it and the skin around it clean and dry. You may clean these areas with mild soap and water. Do not clean the area with peroxide, alcohol, or iodine.  If you have a wound, scrape, corn, or callus on your foot, look at it several times a day to make sure it is healing and not infected. Check for: ? Redness, swelling, or pain. ? Fluid or blood. ? Warmth. ? Pus or a bad smell. General instructions  Do not cross your legs. This may decrease blood flow to your feet.  Do not use heating pads or hot water bottles on your feet. They may burn your skin. If you have lost feeling in your feet or legs, you may not know this is happening until it is too late.  Protect your feet from hot and cold by wearing shoes, such as at the beach or on hot pavement.  Schedule a complete foot exam at least once a year (annually) or more often if you have foot problems. If you have foot problems, report any cuts, sores, or bruises to your health care provider immediately. Contact a health care provider if:  You have a medical condition that increases your risk of infection and you have any cuts, sores,  or bruises on your feet.  You have an injury that is not healing.  You have redness on your legs or feet.  You feel burning or tingling in your legs or feet.  You have pain or cramps in your legs and feet.  Your legs or feet are numb.  Your feet always feel cold.  You have pain around a toenail. Get help right away if:  You have a wound, scrape, corn, or callus on your foot and: ? You have pain, swelling, or redness that gets worse. ? You have fluid or blood coming from the wound, scrape, corn, or callus. ? Your wound, scrape, corn, or callus feels warm to the touch. ? You have pus or a bad smell coming from the wound, scrape, corn, or callus. ? You have a fever. ? You have a  red line going up your leg. Summary  Check your feet every day for cuts, sores, red spots, swelling, and blisters.  Moisturize feet and legs daily.  Wear shoes that fit properly and have enough cushioning.  If you have foot problems, report any cuts, sores, or bruises to your health care provider immediately.  Schedule a complete foot exam at least once a year (annually) or more often if you have foot problems. This information is not intended to replace advice given to you by your health care provider. Make sure you discuss any questions you have with your health care provider. Document Released: 06/23/2000 Document Revised: 08/08/2017 Document Reviewed: 07/28/2016 Elsevier Interactive Patient Education  2019 [employer].

## 2018-08-11 ENCOUNTER — Encounter: Payer: Self-pay | Admitting: Podiatry

## 2018-08-11 NOTE — Progress Notes (Signed)
Subjective: Ryan Case presents today with painful, thick toenails 1-5 b/l that he cannot cut and which interfere with daily activities.  Pain is aggravated when wearing enclosed shoe gear.  Per caregiver, Ryan Case has a habit of pulling his toenails and fingernails off. He has pulled his right 3rd toenail off.   Ryan Able, MD is his PCP.   Current Outpatient Medications:  .  amitriptyline (ELAVIL) 25 MG tablet, Take 25 mg by mouth at bedtime.  , Disp: , Rfl:  .  aspirin 81 MG tablet, Take 81 mg by mouth daily.  , Disp: , Rfl:  .  benztropine (COGENTIN) 1 MG tablet, Take 1 mg by mouth daily.  , Disp: , Rfl:  .  Betamethasone Valerate (LUXIQ EX), Apply 17 % topically as needed.  , Disp: , Rfl:  .  carvedilol (COREG) 3.125 MG tablet, Take 3.125 mg by mouth 2 (two) times daily with a meal.  , Disp: , Rfl:  .  ciclopirox (LOPROX) 0.77 % cream, Apply topically as needed.  , Disp: , Rfl:  .  desmopressin (DDAVP) 0.2 MG tablet, Take 0.2 mg by mouth daily.  , Disp: , Rfl:  .  Dextrose, Diabetic Use, (GLUCOSE PO), Take by mouth.  , Disp: , Rfl:  .  digoxin (LANOXIN) 0.25 MG tablet, Take 250 mcg by mouth daily.  , Disp: , Rfl:  .  famotidine (PEPCID) 20 MG tablet, Take 20 mg by mouth daily.  , Disp: , Rfl:  .  fenofibrate (TRICOR) 145 MG tablet, Take 48 mg by mouth daily. , Disp: , Rfl:  .  furosemide (LASIX) 40 MG tablet, Take 40 mg by mouth daily.  , Disp: , Rfl:  .  gabapentin (NEURONTIN) 400 MG capsule, Take 400 mg by mouth 3 (three) times daily. Takes all three capsules at bedtime., Disp: , Rfl:  .  glipiZIDE (GLIPIZIDE XL) 10 MG 24 hr tablet, Take 20 mg by mouth daily.  , Disp: , Rfl:  .  Loratadine 10 MG CAPS, Take by mouth daily. Taking PRN., Disp: , Rfl:  .  LORazepam (ATIVAN) 1 MG tablet, Take 1 mg by mouth daily. , Disp: , Rfl:  .  LORAZEPAM PO, Take 0.5 mg by mouth daily. , Disp: , Rfl:  .  Metoclopramide HCl 10 MG TBDP, Take 1 tablet by mouth 2 (two) times daily.  , Disp: ,  Rfl:  .  OMEPRAZOLE PO, Take 20 mg by mouth daily.  , Disp: , Rfl:  .  POTASSIUM CHLORIDE CR PO, Take 10 mg by mouth 2 (two) times daily.  , Disp: , Rfl:  .  risperiDONE (RISPERDAL) 3 MG tablet, Take 3 mg by mouth 2 (two) times daily.  , Disp: , Rfl:  .  theophylline (THEODUR) 200 MG 12 hr tablet, Take 200 mg by mouth 2 (two) times daily.  , Disp: , Rfl:  .  tolnaftate (TINACTIN) 1 % spray, Apply topically 2 (two) times daily., Disp: , Rfl:  .  traZODone (DESYREL) 50 MG tablet, , Disp: , Rfl:   No Known Allergies  Objective:  Vascular Examination: Capillary refill time immediate x 10 digits  Dorsalis pedis and Posterior tibial pulses palpable b/l  Digital hair present x 10 digits  Skin temperature gradient WNL b/l  Dermatological Examination: Skin with normal turgor, texture and tone b/l  Toenails 1-5 left, 1, 2, 4, 5 right discolored, thick, dystrophic with subungual debris and pain with palpation to nailbeds due to thickness  of nails.  Evidence of toe devoid of nailplate right 3rd digit. Visible dried heme. No erythema, no edema, no active bleeding/drainage. No signs of infection.   Musculoskeletal: Muscle strength 5/5 to all LE muscle groups  No gross bony deformities b/l.  No pain, crepitus or joint limitation noted with ROM.   Neurological: Sensation intact with 10 gram monofilament. Vibratory sensation intact.  Assessment: Painful onychomycosis toenails 1-5 left, 1, 2, 4, 5 right  Onycholysis of nailplate right 3rd digit  Plan: 1. Toenails 1-5 b/l were debrided in length and girth without iatrogenic bleeding. 2. Right 3rd digit cleansed with wound cleanser. Triple antibiotic ointment and bandaid applied. Caregiver instructed to apply triple antibiotic ointment to digit once daily for 7 days, then stop. 3. Patient to continue soft, supportive shoe gear 4. Patient to report any pedal injuries to medical professional immediately. 5. Follow up 3 months.   6. Patient/POA to call should there be a concern in the interim.

## 2018-10-07 ENCOUNTER — Ambulatory Visit: Payer: Medicare Other | Admitting: Audiology

## 2018-10-23 ENCOUNTER — Ambulatory Visit: Payer: Medicare Other | Admitting: Podiatry

## 2019-02-05 ENCOUNTER — Other Ambulatory Visit: Payer: Self-pay

## 2019-02-05 DIAGNOSIS — Z20822 Contact with and (suspected) exposure to covid-19: Secondary | ICD-10-CM

## 2019-02-07 LAB — NOVEL CORONAVIRUS, NAA: SARS-CoV-2, NAA: NOT DETECTED

## 2019-02-11 ENCOUNTER — Telehealth: Payer: Self-pay | Admitting: Family Medicine

## 2019-02-11 NOTE — Telephone Encounter (Signed)
Pt returning call for covd results, negative; pt verbalizes understanding.

## 2019-05-21 ENCOUNTER — Other Ambulatory Visit: Payer: Self-pay

## 2019-05-21 DIAGNOSIS — Z20822 Contact with and (suspected) exposure to covid-19: Secondary | ICD-10-CM

## 2019-05-24 LAB — NOVEL CORONAVIRUS, NAA: SARS-CoV-2, NAA: NOT DETECTED

## 2019-09-23 ENCOUNTER — Ambulatory Visit (INDEPENDENT_AMBULATORY_CARE_PROVIDER_SITE_OTHER): Payer: Medicare Other | Admitting: Podiatry

## 2019-09-23 ENCOUNTER — Other Ambulatory Visit: Payer: Self-pay

## 2019-09-23 ENCOUNTER — Encounter: Payer: Self-pay | Admitting: Podiatry

## 2019-09-23 VITALS — Temp 97.0°F

## 2019-09-23 DIAGNOSIS — M2042 Other hammer toe(s) (acquired), left foot: Secondary | ICD-10-CM | POA: Diagnosis not present

## 2019-09-23 DIAGNOSIS — B351 Tinea unguium: Secondary | ICD-10-CM | POA: Diagnosis not present

## 2019-09-23 DIAGNOSIS — M79675 Pain in left toe(s): Secondary | ICD-10-CM

## 2019-09-23 DIAGNOSIS — M2041 Other hammer toe(s) (acquired), right foot: Secondary | ICD-10-CM | POA: Diagnosis not present

## 2019-09-23 DIAGNOSIS — E119 Type 2 diabetes mellitus without complications: Secondary | ICD-10-CM | POA: Diagnosis not present

## 2019-09-23 DIAGNOSIS — M79674 Pain in right toe(s): Secondary | ICD-10-CM

## 2019-09-23 NOTE — Progress Notes (Signed)
Subjective: Ryan Case presents today for follow up of preventative diabetic foot care, for diabetic foot evaluation and painful mycotic nails b/l that are difficult to trim. Pain interferes with ambulation. Aggravating factors include wearing enclosed shoe gear. Pain is relieved with periodic professional debridement.   Patient's caregiver is present during the visit. Patient states his toenails are extremely long, especially left 2nd digit.  No Known Allergies   Objective: Vitals:   09/23/19 1331  Temp: (!) 97 F (36.1 C)    Pt 49 y.o. year old AA  male  WD, WN in NAD. AAO x 3.   Vascular Examination:  Capillary refill time to digits immediate b/l. Palpable DP pulses b/l. Palpable PT pulses b/l. Pedal hair present b/l. Skin temperature gradient within normal limits b/l.  Dermatological Examination: Pedal skin with normal turgor, texture and tone bilaterally. No open wounds bilaterally. No interdigital macerations bilaterally. Toenails 1-5 b/l elongated, dystrophic, thickened, crumbly with subungual debris and tenderness to dorsal palpation.  Musculoskeletal: Normal muscle strength 5/5 to all lower extremity muscle groups bilaterally, no gross bony deformities bilaterally, no pain crepitus or joint limitation noted with ROM b/l and hammertoes noted to the  2-5 bilaterally  Neurological: Protective sensation intact 5/5 intact bilaterally with 10g monofilament b/l Vibratory sensation intact b/l  Assessment: 1. Pain due to onychomycosis of toenails of both feet   2. Acquired hammertoes of both feet   3. Type 2 diabetes mellitus without complication, without long-term current use of insulin (HCC)   4. Encounter for diabetic foot exam (HCC)    Plan: -Diabetic foot examination performed on today's visit. -Continue diabetic foot care principles. Literature dispensed on today.  -Toenails 1-5 b/l were debrided in length and girth with sterile nail nippers and dremel without  iatrogenic bleeding.  -Patient to continue soft, supportive shoe gear daily. -Patient to report any pedal injuries to medical professional immediately. -Patient/POA to call should there be question/concern in the interim.  Return in about 3 months (around 12/24/2019) for diabetic nail trim.

## 2019-09-23 NOTE — Patient Instructions (Signed)

## 2019-12-24 ENCOUNTER — Encounter: Payer: Self-pay | Admitting: Podiatry

## 2019-12-24 ENCOUNTER — Ambulatory Visit (INDEPENDENT_AMBULATORY_CARE_PROVIDER_SITE_OTHER): Payer: Medicare Other | Admitting: Podiatry

## 2019-12-24 ENCOUNTER — Other Ambulatory Visit: Payer: Self-pay

## 2019-12-24 DIAGNOSIS — M79675 Pain in left toe(s): Secondary | ICD-10-CM

## 2019-12-24 DIAGNOSIS — E119 Type 2 diabetes mellitus without complications: Secondary | ICD-10-CM

## 2019-12-24 DIAGNOSIS — M2041 Other hammer toe(s) (acquired), right foot: Secondary | ICD-10-CM

## 2019-12-24 DIAGNOSIS — B351 Tinea unguium: Secondary | ICD-10-CM

## 2019-12-24 DIAGNOSIS — M2042 Other hammer toe(s) (acquired), left foot: Secondary | ICD-10-CM

## 2019-12-24 DIAGNOSIS — M79674 Pain in right toe(s): Secondary | ICD-10-CM

## 2019-12-24 NOTE — Patient Instructions (Signed)
Diabetes Mellitus and Foot Care Foot care is an important part of your health, especially when you have diabetes. Diabetes may cause you to have problems because of poor blood flow (circulation) to your feet and legs, which can cause your skin to:  Become thinner and drier.  Break more easily.  Heal more slowly.  Peel and crack. You may also have nerve damage (neuropathy) in your legs and feet, causing decreased feeling in them. This means that you may not notice minor injuries to your feet that could lead to more serious problems. Noticing and addressing any potential problems early is the best way to prevent future foot problems. How to care for your feet Foot hygiene  Wash your feet daily with warm water and mild soap. Do not use hot water. Then, pat your feet and the areas between your toes until they are completely dry. Do not soak your feet as this can dry your skin.  Trim your toenails straight across. Do not dig under them or around the cuticle. File the edges of your nails with an emery board or nail file.  Apply a moisturizing lotion or petroleum jelly to the skin on your feet and to dry, brittle toenails. Use lotion that does not contain alcohol and is unscented. Do not apply lotion between your toes. Shoes and socks  Wear clean socks or stockings every day. Make sure they are not too tight. Do not wear knee-high stockings since they may decrease blood flow to your legs.  Wear shoes that fit properly and have enough cushioning. Always look in your shoes before you put them on to be sure there are no objects inside.  To break in new shoes, wear them for just a few hours a day. This prevents injuries on your feet. Wounds, scrapes, corns, and calluses  Check your feet daily for blisters, cuts, bruises, sores, and redness. If you cannot see the bottom of your feet, use a mirror or ask someone for help.  Do not cut corns or calluses or try to remove them with medicine.  If you  find a minor scrape, cut, or break in the skin on your feet, keep it and the skin around it clean and dry. You may clean these areas with mild soap and water. Do not clean the area with peroxide, alcohol, or iodine.  If you have a wound, scrape, corn, or callus on your foot, look at it several times a day to make sure it is healing and not infected. Check for: ? Redness, swelling, or pain. ? Fluid or blood. ? Warmth. ? Pus or a bad smell. General instructions  Do not cross your legs. This may decrease blood flow to your feet.  Do not use heating pads or hot water bottles on your feet. They may burn your skin. If you have lost feeling in your feet or legs, you may not know this is happening until it is too late.  Protect your feet from hot and cold by wearing shoes, such as at the beach or on hot pavement.  Schedule a complete foot exam at least once a year (annually) or more often if you have foot problems. If you have foot problems, report any cuts, sores, or bruises to your health care provider immediately. Contact a health care provider if:  You have a medical condition that increases your risk of infection and you have any cuts, sores, or bruises on your feet.  You have an injury that is not   healing.  You have redness on your legs or feet.  You feel burning or tingling in your legs or feet.  You have pain or cramps in your legs and feet.  Your legs or feet are numb.  Your feet always feel cold.  You have pain around a toenail. Get help right away if:  You have a wound, scrape, corn, or callus on your foot and: ? You have pain, swelling, or redness that gets worse. ? You have fluid or blood coming from the wound, scrape, corn, or callus. ? Your wound, scrape, corn, or callus feels warm to the touch. ? You have pus or a bad smell coming from the wound, scrape, corn, or callus. ? You have a fever. ? You have a red line going up your leg. Summary  Check your feet every day  for cuts, sores, red spots, swelling, and blisters.  Moisturize feet and legs daily.  Wear shoes that fit properly and have enough cushioning.  If you have foot problems, report any cuts, sores, or bruises to your health care provider immediately.  Schedule a complete foot exam at least once a year (annually) or more often if you have foot problems. This information is not intended to replace advice given to you by your health care provider. Make sure you discuss any questions you have with your health care provider. Document Revised: 03/19/2019 Document Reviewed: 07/28/2016 Elsevier Patient Education  2020 [employer]  

## 2019-12-24 NOTE — Progress Notes (Signed)
Subjective: Ryan Case is a pleasant 49 y.o. male patient seen today preventative diabetic foot care and painful mycotic nails b/l that are difficult to trim. Pain interferes with ambulation. Aggravating factors include wearing enclosed shoe gear. Pain is relieved with periodic professional debridement.  Case from group home present during today's visit. Ryan Case voice any new concerns on today's visit.  Past Medical History:  Diagnosis Date  . CHF (congestive heart failure) (HCC)   . Diabetes mellitus   . GERD (gastroesophageal reflux disease)   . Heart attack (HCC)   . History of bladder problems   . Hypertension   . Intermittent explosive disorder   . Sinus complaint   . Sleep apnea   . Visual impairment     Patient Active Problem List   Diagnosis Date Noted  . Sinus complaint 01/04/2011  . Muscle pain 01/04/2011  . Diabetes mellitus (HCC) 01/04/2011  . Sleep apnea 01/04/2011  . CHF (congestive heart failure) (HCC) 01/04/2011  . 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 Hypertension 01/04/2011  . Anxiety 01/04/2011  . Bruises easily 01/04/2011  . GERD (gastroesophageal reflux disease) 01/04/2011  . Asthma 01/04/2011    Current Outpatient Medications on File Prior to Visit  Medication Sig Dispense Refill  . amitriptyline (ELAVIL) 25 MG tablet Take 25 mg by mouth at bedtime.      Marland Kitchen aspirin 81 MG tablet Take 81 mg by mouth daily.      . benztropine (COGENTIN) 1 MG tablet Take 1 mg by mouth daily.      . Betamethasone Valerate (LUXIQ EX) Apply 17 % topically as needed.      . carvedilol (COREG) 3.125 MG tablet Take 3.125 mg by mouth 2 (two) times daily with a meal.      . ciclopirox (LOPROX) 0.77 % cream Apply topically as needed.      . desmopressin (DDAVP) 0.2 MG tablet Take 0.2 mg by mouth daily.      Marland Kitchen Dextrose, Diabetic Use, (GLUCOSE PO) Take by mouth.      . digoxin (LANOXIN) 0.25 MG tablet Take 250 mcg by mouth daily.      . famotidine  (PEPCID) 20 MG tablet Take 20 mg by mouth daily.      . fenofibrate (TRICOR) 145 MG tablet Take 48 mg by mouth daily.     . furosemide (LASIX) 40 MG tablet Take 40 mg by mouth daily.      Marland Kitchen gabapentin (NEURONTIN) 400 MG capsule Take 400 mg by mouth 3 (three) times daily. Takes all three capsules at bedtime.    Marland Kitchen glipiZIDE (GLIPIZIDE XL) 10 MG 24 hr tablet Take 20 mg by mouth daily.      Marland Kitchen glipiZIDE-metformin (METAGLIP) 5-500 MG tablet Take 1 tablet by mouth daily.    . Loratadine 10 MG CAPS Take by mouth daily. Taking PRN.    . LORazepam (ATIVAN) 1 MG tablet Take 1 mg by mouth daily.     Marland Kitchen LORAZEPAM PO Take 0.5 mg by mouth daily.     . Metoclopramide HCl 10 MG TBDP Take 1 tablet by mouth 2 (two) times daily.      Marland Kitchen OMEPRAZOLE PO Take 20 mg by mouth daily.      Marland Kitchen POTASSIUM CHLORIDE CR PO Take 10 mg by mouth 2 (two) times daily.      . QC LO-DOSE ASPIRIN 81 MG EC tablet Take 81 mg by mouth daily.    . risperiDONE (RISPERDAL) 3 MG tablet  Take 3 mg by mouth 2 (two) times daily.      . simvastatin (ZOCOR) 20 MG tablet Take 20 mg by mouth daily.    . theophylline (THEODUR) 200 MG 12 hr tablet Take 200 mg by mouth 2 (two) times daily.      . theophylline (UNIPHYL) 400 MG 24 hr tablet Take 400 mg by mouth daily.    Marland Kitchen tolnaftate (TINACTIN) 1 % spray Apply topically 2 (two) times daily.    . traZODone (DESYREL) 50 MG tablet      No current facility-administered medications on file prior to visit.    No Known Allergies  Objective: Physical Exam  General: Ryan Case is a pleasant 49 y.o. African American male, in NAD. AAO x 3.   Vascular:  Neurovascular status unchanged b/l lower extremities. Capillary refill time to digits immediate b/l. Palpable DP pulses b/l. Palpable PT pulses b/l. Pedal hair present b/l. Skin temperature gradient within normal limits b/l. No pain with calf compression b/l. No edema noted b/l.  Dermatological:  Pedal skin with normal turgor, texture and tone  bilaterally. No open wounds bilaterally. No interdigital macerations bilaterally. Toenails 1-5 b/l elongated, discolored, dystrophic, thickened, crumbly with subungual debris and tenderness to dorsal palpation.  Musculoskeletal:  Normal muscle strength 5/5 to all lower extremity muscle groups bilaterally. No pain crepitus or joint limitation noted with ROM b/l. Hammertoes noted to the 2-5 bilaterally. Patient ambulates independent of any assistive aids.  Neurological:  Protective sensation intact 5/5 intact bilaterally with 10g monofilament b/l. Vibratory sensation intact b/l. Proprioception intact bilaterally.  Assessment and Plan:  1. Pain due to onychomycosis of toenails of both feet   2. Acquired hammertoes of both feet   3. Type 2 diabetes mellitus without complication, without long-term current use of insulin ([place])    -Examined patient. -No new findings. No new orders. -Toenails 1-5 b/l were debrided in length and girth with sterile nail nippers and dremel without iatrogenic bleeding.  -Patient to report any pedal injuries to medical professional immediately. -Patient to continue soft, supportive shoe gear daily. -Patient/POA to call should there be question/concern in the interim.  Return in about 3 months (around 03/25/2020) for diabetic nail trim.  Marzetta Board, DPM

## 2020-03-23 ENCOUNTER — Other Ambulatory Visit: Payer: Self-pay | Admitting: Urology

## 2020-03-31 ENCOUNTER — Other Ambulatory Visit: Payer: Self-pay

## 2020-03-31 ENCOUNTER — Ambulatory Visit (INDEPENDENT_AMBULATORY_CARE_PROVIDER_SITE_OTHER): Payer: Medicare Other | Admitting: Podiatry

## 2020-03-31 ENCOUNTER — Ambulatory Visit (INDEPENDENT_AMBULATORY_CARE_PROVIDER_SITE_OTHER): Payer: Medicare Other

## 2020-03-31 ENCOUNTER — Encounter: Payer: Self-pay | Admitting: Podiatry

## 2020-03-31 DIAGNOSIS — M79675 Pain in left toe(s): Secondary | ICD-10-CM

## 2020-03-31 DIAGNOSIS — B351 Tinea unguium: Secondary | ICD-10-CM | POA: Diagnosis not present

## 2020-03-31 DIAGNOSIS — M2042 Other hammer toe(s) (acquired), left foot: Secondary | ICD-10-CM

## 2020-03-31 DIAGNOSIS — M795 Residual foreign body in soft tissue: Secondary | ICD-10-CM

## 2020-03-31 DIAGNOSIS — E119 Type 2 diabetes mellitus without complications: Secondary | ICD-10-CM

## 2020-03-31 DIAGNOSIS — M79674 Pain in right toe(s): Secondary | ICD-10-CM

## 2020-03-31 MED ORDER — NEOSPORIN PLUS PAIN RELIEF MS 3.5-10000-10 EX CREA: TOPICAL_CREAM | CUTANEOUS | 0 refills | Status: DC
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
## 2020-06-30 ENCOUNTER — Other Ambulatory Visit: Payer: Self-pay

## 2020-06-30 ENCOUNTER — Ambulatory Visit (INDEPENDENT_AMBULATORY_CARE_PROVIDER_SITE_OTHER): Payer: Medicare Other | Admitting: Podiatry

## 2020-06-30 ENCOUNTER — Encounter: Payer: Self-pay | Admitting: Podiatry

## 2020-06-30 DIAGNOSIS — B351 Tinea unguium: Secondary | ICD-10-CM | POA: Diagnosis not present

## 2020-06-30 DIAGNOSIS — M2041 Other hammer toe(s) (acquired), right foot: Secondary | ICD-10-CM

## 2020-06-30 DIAGNOSIS — M79674 Pain in right toe(s): Secondary | ICD-10-CM | POA: Diagnosis not present

## 2020-06-30 DIAGNOSIS — M2042 Other hammer toe(s) (acquired), left foot: Secondary | ICD-10-CM

## 2020-06-30 DIAGNOSIS — M79675 Pain in left toe(s): Secondary | ICD-10-CM

## 2020-06-30 DIAGNOSIS — E119 Type 2 diabetes mellitus without complications: Secondary | ICD-10-CM

## 2020-07-07 NOTE — Progress Notes (Signed)
Subjective:  Patient ID: Ryan Case, male    DOB: Jan 03, 1971,  MRN: [id number]  49 y.o. male presents with preventative diabetic foot care and painful thick toenails that are difficult to trim. Pain interferes with ambulation. Aggravating factors include wearing enclosed shoe gear. Pain is relieved with periodic professional debridement..    His caregiver is present during today's visit. Caregiver, nor Mr. Paige voice any new problems on today's visit.  PCP: [name], MD.  Review of Systems: Negative except as noted in the HPI.  Past Medical History:  Diagnosis Date  . CHF (congestive heart failure) (HCC)   . Diabetes mellitus   . GERD (gastroesophageal reflux disease)   . Heart attack (HCC)   . History of bladder problems   . Hypertension   . Intermittent explosive disorder   . Sinus complaint   . Sleep apnea   . Visual impairment    History reviewed. No pertinent surgical history. Patient Active Problem List   Diagnosis Date Noted  . Sinus complaint 01/04/2011  . Muscle pain 01/04/2011  . Diabetes mellitus (HCC) 01/04/2011  . Sleep apnea 01/04/2011  . CHF (congestive heart failure) (HCC) 01/04/2011  . 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 Hypertension 01/04/2011  . Anxiety 01/04/2011  . Bruises easily 01/04/2011  . GERD (gastroesophageal reflux disease) 01/04/2011  . Asthma 01/04/2011    Current Outpatient Medications:  .  albuterol (VENTOLIN HFA) 108 (90 Base) MCG/ACT inhaler, Inhale 2 puffs into the lungs every 4 (four) hours as needed., Disp: , Rfl:  .  amitriptyline (ELAVIL) 25 MG tablet, Take 25 mg by mouth at bedtime.  , Disp: , Rfl:  .  aspirin 81 MG tablet, Take 81 mg by mouth daily.  , Disp: , Rfl:  .  benztropine (COGENTIN) 1 MG tablet, Take 1 mg by mouth daily.  , Disp: , Rfl:  .  Betamethasone Valerate (LUXIQ EX), Apply 17 % topically as needed.  , Disp: , Rfl:  .  carvedilol (COREG) 3.125 MG tablet, Take 3.125 mg by mouth 2 (two) times daily  with a meal.  , Disp: , Rfl:  .  ciclopirox (LOPROX) 0.77 % cream, Apply topically as needed.  , Disp: , Rfl:  .  desmopressin (DDAVP) 0.2 MG tablet, TAKE TWO TABLETS EVERY DAY, Disp: 180 tablet, Rfl: 3 .  Dextrose, Diabetic Use, (GLUCOSE PO), Take by mouth.  , Disp: , Rfl:  .  digoxin (LANOXIN) 0.25 MG tablet, Take 250 mcg by mouth daily.  , Disp: , Rfl:  .  famotidine (PEPCID) 20 MG tablet, Take 20 mg by mouth daily.  , Disp: , Rfl:  .  fenofibrate (TRICOR) 145 MG tablet, Take 48 mg by mouth daily. , Disp: , Rfl:  .  furosemide (LASIX) 40 MG tablet, Take 40 mg by mouth daily.  , Disp: , Rfl:  .  gabapentin (NEURONTIN) 400 MG capsule, Take 400 mg by mouth 3 (three) times daily. Takes all three capsules at bedtime., Disp: , Rfl:  .  glipiZIDE (GLIPIZIDE XL) 10 MG 24 hr tablet, Take 20 mg by mouth daily.  , Disp: , Rfl:  .  glipiZIDE-metformin (METAGLIP) 5-500 MG tablet, Take 1 tablet by mouth daily., Disp: , Rfl:  .  Loratadine 10 MG CAPS, Take by mouth daily. Taking PRN., Disp: , Rfl:  .  LORazepam (ATIVAN) 1 MG tablet, Take 1 mg by mouth daily. , Disp: , Rfl:  .  LORAZEPAM PO, Take 0.5 mg by mouth daily. ,  Disp: , Rfl:  .  Metoclopramide HCl 10 MG TBDP, Take 1 tablet by mouth 2 (two) times daily.  , Disp: , Rfl:  .  neomycin-polymyxin-pramoxine (NEOSPORIN PLUS) 1 % cream, Apply to left foot lacerations once daily until healed., Disp: 14.2 g, Rfl: 0 .  OMEPRAZOLE PO, Take 20 mg by mouth daily.  , Disp: , Rfl:  .  potassium chloride (KLOR-CON) 10 MEQ tablet, Take 20 mEq by mouth daily., Disp: , Rfl:  .  POTASSIUM CHLORIDE CR PO, Take 10 mg by mouth 2 (two) times daily.  , Disp: , Rfl:  .  QC LO-DOSE ASPIRIN 81 MG EC tablet, Take 81 mg by mouth daily., Disp: , Rfl:  .  risperiDONE (RISPERDAL) 3 MG tablet, Take 3 mg by mouth 2 (two) times daily.  , Disp: , Rfl:  .  simvastatin (ZOCOR) 20 MG tablet, Take 20 mg by mouth daily., Disp: , Rfl:  .  theophylline (THEODUR) 200 MG 12 hr tablet, Take 200  mg by mouth 2 (two) times daily.  , Disp: , Rfl:  .  theophylline (UNIPHYL) 400 MG 24 hr tablet, Take 400 mg by mouth daily., Disp: , Rfl:  .  tolnaftate (TINACTIN) 1 % spray, Apply topically 2 (two) times daily., Disp: , Rfl:  .  Transparent Dressings (NEXCARE TEGADERM 4"X4-3/4") MISC, Apply topically as directed., Disp: , Rfl:  .  traZODone (DESYREL) 50 MG tablet, , Disp: , Rfl:  No Known Allergies Social History   Tobacco Use  Smoking Status Former Smoker  Smokeless Tobacco Never Used    Objective:  There were no vitals filed for this visit. Constitutional Patient is a pleasant 49 y.o. African American male WD, WN in NAD. AAO x 3.  Vascular Capillary refill time to digits immediate b/l. Palpable pedal pulses b/l LE. Pedal hair present. Lower extremity skin temperature gradient within normal limits. No edema noted b/l lower extremities. No cyanosis or clubbing noted.  Neurologic Normal speech. Protective sensation intact 5/5 intact bilaterally with 10g monofilament b/l. Vibratory sensation intact b/l.  Dermatologic Pedal skin with normal turgor, texture and tone bilaterally. No open wounds bilaterally. No interdigital macerations bilaterally. Toenails 1-5 b/l elongated, discolored, dystrophic, thickened, crumbly with subungual debris and tenderness to dorsal palpation.  Orthopedic: Normal muscle strength 5/5 to all lower extremity muscle groups bilaterally. No pain crepitus or joint limitation noted with ROM b/l. Hammertoes noted to the 2-5 bilaterally. Patient ambulates independent of any assistive aids.   No flowsheet data found.     Assessment:   1. Pain due to onychomycosis of toenails of both feet   2. Acquired hammertoes of both feet   3. Type 2 diabetes mellitus without complication, without long-term current use of insulin (HCC)    Plan:  Patient was evaluated and treated and all questions answered.  Onychomycosis with pain -Nails palliatively debridement as  below. -Educated on self-care  Procedure: Nail Debridement Rationale: Pain Type of Debridement: manual, sharp debridement. Instrumentation: Nail nipper, rotary burr. Number of Nails: 10  -Examined patient. -Continue diabetic foot care principles. -Patient to continue soft, supportive shoe gear daily. -Toenails 1-5 b/l were debrided in length and girth with sterile nail nippers and dremel without iatrogenic bleeding.  -Patient to report any pedal injuries to medical professional immediately. -Patient/POA to call should there be question/concern in the interim.  Return in about 3 months (around 09/28/2020).  Freddie Breech, DPM

## 2020-07-20 ENCOUNTER — Other Ambulatory Visit: Payer: Self-pay

## 2020-07-20 ENCOUNTER — Other Ambulatory Visit: Payer: Medicare Other

## 2020-07-20 DIAGNOSIS — Z20822 Contact with and (suspected) exposure to covid-19: Secondary | ICD-10-CM

## 2020-07-22 LAB — SARS-COV-2, NAA 2 DAY TAT

## 2020-07-22 LAB — NOVEL CORONAVIRUS, NAA: SARS-CoV-2, NAA: NOT DETECTED

## 2020-09-29 ENCOUNTER — Encounter: Payer: Self-pay | Admitting: Podiatry

## 2020-09-29 ENCOUNTER — Other Ambulatory Visit: Payer: Self-pay

## 2020-09-29 ENCOUNTER — Ambulatory Visit (INDEPENDENT_AMBULATORY_CARE_PROVIDER_SITE_OTHER): Payer: Medicare Other | Admitting: Podiatry

## 2020-09-29 DIAGNOSIS — L603 Nail dystrophy: Secondary | ICD-10-CM | POA: Diagnosis not present

## 2020-09-29 DIAGNOSIS — B351 Tinea unguium: Secondary | ICD-10-CM | POA: Diagnosis not present

## 2020-09-29 DIAGNOSIS — M79675 Pain in left toe(s): Secondary | ICD-10-CM

## 2020-09-29 DIAGNOSIS — E119 Type 2 diabetes mellitus without complications: Secondary | ICD-10-CM

## 2020-09-29 DIAGNOSIS — M2041 Other hammer toe(s) (acquired), right foot: Secondary | ICD-10-CM

## 2020-09-29 DIAGNOSIS — M79674 Pain in right toe(s): Secondary | ICD-10-CM | POA: Diagnosis not present

## 2020-10-03 NOTE — Progress Notes (Signed)
  Subjective:  Patient ID: Ryan Case, male    DOB: 24-Oct-1970,  MRN: [id number]  50 y.o. male presents with preventative diabetic foot care and painful thick toenails that are difficult to trim. Pain interferes with ambulation. Aggravating factors include wearing enclosed shoe gear. Pain is relieved with periodic professional debridement..    His caregiver is present during today's visit. Caregiver states [name] has outburst where he kicks doors. He has a cracked toenail of the left great toe and states it may have happened during one of [name] explosive episodes.  PCP: [name], MD.  Review of Systems: Negative except as noted in the HPI.   No Known Allergies    Objective:  There were no vitals filed for this visit. Constitutional Patient is a pleasant 50 y.o. African American male WD, WN in NAD. AAO x 3.  Vascular Capillary refill time to digits immediate b/l. Palpable pedal pulses b/l LE. Pedal hair present. Lower extremity skin temperature gradient within normal limits. No edema noted b/l lower extremities. No cyanosis or clubbing noted.  Neurologic Normal speech. Protective sensation intact 5/5 intact bilaterally with 10g monofilament b/l. Vibratory sensation intact b/l.  Dermatologic Pedal skin with normal turgor, texture and tone bilaterally. No open wounds bilaterally. No interdigital macerations bilaterally. Toenails 1-5 b/l elongated, discolored, dystrophic, thickened, crumbly with subungual debris and tenderness to dorsal palpation. Cracked nailplate into medial border left hallux. No break in skin. No erythema, no edema, no drainage, no fluctuance. No ecchymosis.  Orthopedic: Normal muscle strength 5/5 to all lower extremity muscle groups bilaterally. No pain crepitus or joint limitation noted with ROM b/l. Hammertoes noted to the 2-5 bilaterally. Patient ambulates independent of any assistive aids.    Assessment:   1. Pain due to onychomycosis of toenails of  both feet   2. Cracked nails   3. Acquired hammertoes of both feet   4. Type 2 diabetes mellitus without complication, without long-term current use of insulin (HCC)    Plan:  Patient was evaluated and treated and all questions answered.  Onychomycosis with pain -Nails palliatively debridement as below. -Educated on self-care  Procedure: Nail Debridement Rationale: Pain Type of Debridement: manual, sharp debridement. Instrumentation: Nail nipper, rotary burr. Number of Nails: 10  -Examined patient. -Continue diabetic foot care principles. -Patient to continue soft, supportive shoe gear daily. -No treatment required for cracked nailplate. Monitor. -Toenails 1-5 b/l were debrided in length and girth with sterile nail nippers and dremel without iatrogenic bleeding.  -Patient to report any pedal injuries to medical professional immediately. -Patient/POA to call should there be question/concern in the interim.  Return in about 3 months (around 12/30/2020) for nail trim.  Freddie Breech, DPM

## 2021-01-14 ENCOUNTER — Ambulatory Visit (INDEPENDENT_AMBULATORY_CARE_PROVIDER_SITE_OTHER): Payer: Medicare Other | Admitting: Podiatry

## 2021-01-14 ENCOUNTER — Other Ambulatory Visit: Payer: Self-pay

## 2021-01-14 DIAGNOSIS — M79674 Pain in right toe(s): Secondary | ICD-10-CM

## 2021-01-14 DIAGNOSIS — M79675 Pain in left toe(s): Secondary | ICD-10-CM

## 2021-01-14 DIAGNOSIS — B351 Tinea unguium: Secondary | ICD-10-CM

## 2021-01-14 DIAGNOSIS — S99921A Unspecified injury of right foot, initial encounter: Secondary | ICD-10-CM

## 2021-01-16 ENCOUNTER — Encounter: Payer: Self-pay | Admitting: Podiatry

## 2021-01-16 NOTE — Progress Notes (Signed)
  Subjective:  Patient ID: Ryan Case, male    DOB: [birth year],  MRN: [id number]  50 y.o. male presents with preventative diabetic foot care and thick, elongated toenails b/l feet which are tender when wearing enclosed shoe gear.  He is accompanied by his caregiver, Mr. Madilyn Fireman, on today's visit. They voice no new pedal problems on today's visit. He has dried heme on the anterior aspect of his right leg and Mr. Madilyn Fireman states Mr. Bentz may have recently scratched his leg and he will address when they get back home. Mr. Toledo also has dried blood on right great toe and neither relate any episodes of trauma. Mr. Wander is known to kick doors and walls when he gets upset.  Review of Systems: Negative except as noted in the HPI.   No Known Allergies  Objective:  There were no vitals filed for this visit. Constitutional Patient is a pleasant 50 y.o. African American male WD, WN in NAD. AAO x 3.  Vascular Capillary refill time to digits immediate b/l. Palpable pedal pulses b/l LE. Pedal hair sparse. Lower extremity skin temperature gradient within normal limits. No pain with calf compression b/l. No edema noted b/l lower extremities. No cyanosis or clubbing noted.  Neurologic Normal speech. Protective sensation intact 5/5 intact bilaterally with 10g monofilament b/l. Vibratory sensation intact b/l.  Dermatologic Pedal skin with normal turgor, texture and tone b/l lower extremities No interdigital macerations b/l lower extremities Toenails 1-5 b/l elongated, discolored, dystrophic, thickened, crumbly with subungual debris and tenderness to dorsal palpation. Dried heme noted right hallux nailplate. No erythema, no edema, no drainage, no flucutance..  Orthopedic: Normal muscle strength 5/5 to all lower extremity muscle groups bilaterally. No pain crepitus or joint limitation noted with ROM b/l. Hammertoe(s) noted to the 2-5 bilaterally.    Assessment:   1. Pain due to onychomycosis of toenails of both  feet   2. Injury of right great toe, initial encounter    Plan:  Patient was evaluated and treated and all questions answered.  Onychomycosis with pain -Nails palliatively debridement as below. -Educated on self-care  Procedure: Nail Debridement Rationale: Pain Type of Debridement: manual, sharp debridement. Instrumentation: Nail nipper, rotary burr. Number of Nails: 10  -Examined patient. -Continue diabetic foot care principles. -Patient to continue soft, supportive shoe gear daily. -Toenails 1-5 b/l were debrided in length and girth with sterile nail nippers and dremel without iatrogenic bleeding.  -For old injury of right hallux cleansed with alcohol. Triple antibiotic ointment applied. No further treatment required by facility staff. -Patient to report any pedal injuries to medical professional immediately. -Patient/POA to call should there be question/concern in the interim.  Return in about 3 months (around 04/16/2021) for nail trim.  Freddie Breech, DPM

## 2021-03-21 ENCOUNTER — Other Ambulatory Visit: Payer: Self-pay | Admitting: Urology

## 2021-03-28 ENCOUNTER — Ambulatory Visit (HOSPITAL_COMMUNITY)
Admission: EM | Admit: 2021-03-28 | Discharge: 2021-03-28 | Discharge status: Against Medical Advice | Payer: Medicare Other

## 2021-03-28 ENCOUNTER — Other Ambulatory Visit: Payer: Self-pay

## 2021-04-22 ENCOUNTER — Encounter: Payer: Self-pay | Admitting: Podiatry

## 2021-04-22 ENCOUNTER — Other Ambulatory Visit: Payer: Self-pay

## 2021-04-22 ENCOUNTER — Ambulatory Visit (INDEPENDENT_AMBULATORY_CARE_PROVIDER_SITE_OTHER): Payer: Medicare Other | Admitting: Podiatry

## 2021-04-22 DIAGNOSIS — B351 Tinea unguium: Secondary | ICD-10-CM

## 2021-04-22 DIAGNOSIS — M79674 Pain in right toe(s): Secondary | ICD-10-CM

## 2021-04-22 DIAGNOSIS — M79675 Pain in left toe(s): Secondary | ICD-10-CM

## 2021-04-24 NOTE — Progress Notes (Signed)
Patient left without being seen.

## 2021-07-26 ENCOUNTER — Other Ambulatory Visit: Payer: Self-pay

## 2021-07-26 ENCOUNTER — Ambulatory Visit (HOSPITAL_COMMUNITY)
Admission: EM | Admit: 2021-07-26 | Discharge: 2021-07-26 | Disposition: A | Discharge status: Home / Self Care | Payer: Medicare Other | Attending: Family Medicine | Admitting: Family Medicine

## 2021-07-26 ENCOUNTER — Ambulatory Visit (INDEPENDENT_AMBULATORY_CARE_PROVIDER_SITE_OTHER): Payer: Medicare Other

## 2021-07-26 ENCOUNTER — Encounter (HOSPITAL_COMMUNITY): Payer: Self-pay

## 2021-07-26 DIAGNOSIS — S0083XA Contusion of other part of head, initial encounter: Secondary | ICD-10-CM

## 2021-07-26 DIAGNOSIS — S0512XA Contusion of eyeball and orbital tissues, left eye, initial encounter: Secondary | ICD-10-CM

## 2021-07-26 DIAGNOSIS — Z23 Encounter for immunization: Secondary | ICD-10-CM | POA: Diagnosis not present

## 2021-07-26 MED ORDER — SILVER NITRATE-POT NITRATE 75-25 % EX MISC
CUTANEOUS | Status: AC
  Filled 2021-07-26: qty 10

## 2021-07-26 MED ORDER — TETANUS-DIPHTH-ACELL PERTUSSIS 5-2.5-18.5 LF-MCG/0.5 IM SUSY
0.5000 mL | PREFILLED_SYRINGE | Freq: Once | INTRAMUSCULAR | Status: AC
  Administered 2021-07-26: 0.5 mL via INTRAMUSCULAR

## 2021-07-26 MED ORDER — TETANUS-DIPHTH-ACELL PERTUSSIS 5-2.5-18.5 LF-MCG/0.5 IM SUSY
PREFILLED_SYRINGE | INTRAMUSCULAR | Status: AC
  Filled 2021-07-26: qty 0.5

## 2021-07-26 NOTE — ED Triage Notes (Signed)
Pt presents with left eye injury. Pt had a fall coming down the steps and hit his eye on the front door handle

## 2021-07-26 NOTE — ED Provider Notes (Signed)
[facility]    CSN: [id number] Arrival date & time: 07/26/21  [id number]      History   Chief Complaint Chief Complaint  Patient presents with   Eye Injury    left    HPI Ryan Case is a 51 y.o. male.    Eye Injury  Here with swelling and pain his left eye.  This morning he was coming down steps at his group home when he missed the last step or 2 and then fell forward hitting his left cheek on the round doorknob of the front door.  He does take a baby aspirin daily.  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 No pertinent surgical history.     Home Medications    Prior to Admission medications   Medication Sig Start Date End Date Taking? Authorizing Provider  albuterol (VENTOLIN HFA) 108 (90 Base) MCG/ACT inhaler Inhale 2 puffs into the lungs every 4 (four) hours as needed. 03/08/20   [provider]  amitriptyline (ELAVIL) 25 MG tablet Take 25 mg by mouth at bedtime.      [provider]  aspirin 81 MG tablet Take 81 mg by mouth daily.      [provider]  benztropine (COGENTIN) 1 MG tablet Take 1 mg by mouth daily.      [provider]  Betamethasone Valerate (LUXIQ EX) Apply 17 % topically as needed.      [provider]   carvedilol (COREG) 3.125 MG tablet Take 3.125 mg by mouth 2 (two) times daily with a meal.      [provider]  ciclopirox (LOPROX) 0.77 % cream Apply topically as needed.      [provider]  desmopressin (DDAVP) 0.2 MG tablet TAKE TWO TABLETS EVERY DAY 03/24/21   McKenzie, Mardene CelestePatrick L, MD  Dextrose, Diabetic Use, (GLUCOSE PO) Take by mouth.      [provider]  digoxin (LANOXIN) 0.25 MG tablet Take 250 mcg by mouth daily.      [provider]  famotidine (PEPCID) 20 MG tablet Take 20 mg by mouth daily.      [provider]  fenofibrate (TRICOR) 145 MG tablet Take 48 mg by mouth daily.     [provider]  furosemide (LASIX) 40 MG tablet Take 40 mg by mouth daily.      [provider]  gabapentin (NEURONTIN) 400 MG capsule Take 400 mg by mouth 3 (three) times daily. Takes all three capsules at bedtime.    [provider]  glipiZIDE (GLIPIZIDE XL) 10 MG 24 hr tablet Take 20 mg by mouth daily.      [provider]  glipiZIDE-metformin (METAGLIP) 5-500 MG tablet Take  1 tablet by mouth daily. 07/31/19   [provider]  Loratadine 10 MG CAPS Take by mouth daily. Taking PRN.    [provider]  LORazepam (ATIVAN) 1 MG tablet Take 1 mg by mouth daily.     [provider]  LORAZEPAM PO Take 0.5 mg by mouth daily.     [provider]  Metoclopramide HCl 10 MG TBDP Take 1 tablet by mouth 2 (two) times daily.      [provider]  neomycin-polymyxin-pramoxine (NEOSPORIN PLUS) 1 % cream Apply to left foot lacerations once daily until healed. 03/31/20   Freddie Breech, DPM  OMEPRAZOLE PO Take 20 mg by mouth daily.      [provider]  potassium chloride (KLOR-CON) 10 MEQ tablet Take 20 mEq by mouth daily. 03/08/20   [provider]  POTASSIUM CHLORIDE CR PO Take 10 mg by mouth 2 (two) times daily.      [provider]  QC LO-DOSE ASPIRIN 81 MG EC tablet  Take 81 mg by mouth daily. 07/31/19   [provider]  risperiDONE (RISPERDAL) 3 MG tablet Take 3 mg by mouth 2 (two) times daily.      [provider]  simvastatin (ZOCOR) 20 MG tablet Take 20 mg by mouth daily. 04/24/19   [provider]  theophylline (THEODUR) 200 MG 12 hr tablet Take 200 mg by mouth 2 (two) times daily.      [provider]  theophylline (UNIPHYL) 400 MG 24 hr tablet Take 400 mg by mouth daily. 09/15/19   [provider]  tolnaftate (TINACTIN) 1 % spray Apply topically 2 (two) times daily.    [provider]  Transparent Dressings (NEXCARE TEGADERM 4"X4-3/4") MISC Apply topically as directed. 03/16/20   [provider]  traZODone (DESYREL) 50 MG tablet  07/13/18   [provider]    Family History Family History  Problem Relation Age of Onset   Arthritis Other    Cancer Other    Diabetes Other    Hypertension Other    Gout Other     Social History Social History   Tobacco Use   Smoking status: Former   Smokeless tobacco: Never  Substance Use Topics   Alcohol use: No   Drug use: No     Allergies   Patient has no known allergies.   Review of Systems Review of Systems   Physical Exam Triage Vital Signs ED Triage Vitals  Enc Vitals Group     BP 07/26/21 0850 (!) 175/96     Pulse Rate 07/26/21 0850 79     Resp 07/26/21 0850 16     Temp 07/26/21 0850 98.1 F (36.7 C)     Temp Source 07/26/21 0850 Oral     SpO2 07/26/21 0850 95 %     Weight --      Height --      Head Circumference --      Peak Flow --      Pain Score 07/26/21 0852 8     Pain Loc --      Pain Edu? --      Excl. in GC? --    No data found.  Updated Vital Signs BP (!) 175/96 (BP Location: Right Arm)    Pulse 79    Temp 98.1 F (36.7 C) (Oral)    Resp 16    SpO2 95%   Visual Acuity Right Eye Distance:   Left Eye Distance:  Bilateral Distance:    Right Eye Near:   Left Eye Near:    Bilateral Near:      Physical Exam Vitals reviewed.  Constitutional:      General: He is not in acute distress.    Appearance: He is not toxic-appearing.  HENT:     Head:     Comments: Has hematoma/selling that is ecchymotic under the left eye, involving the lower eyelid. Has an abrasion in the middle of it, has stopped oozing, but has bled onto his left lower cheek. That blood is dried on his lower face. Also has a tiny abrasion inferior/lateral to the hematoma, also no longer bleeding.     Nose: Nose normal.     Mouth/Throat:     Mouth: Mucous membranes are moist.  Eyes:     Extraocular Movements: Extraocular movements intact.     Conjunctiva/sclera: Conjunctivae normal.     Pupils: Pupils are equal, round, and reactive to light.     Comments: No sign of injury to the orbit itself.  Cardiovascular:     Rate and Rhythm: Normal rate and regular rhythm.  Pulmonary:     Effort: Pulmonary effort is normal.     Breath sounds: Normal breath sounds.  Neurological:     Mental Status: He is alert. Mental status is at baseline.     UC Treatments / Results  Labs (all labs ordered are listed, but only abnormal results are displayed) Labs Reviewed - No data to display  EKG   Radiology DG Orbits  Result Date: 07/26/2021 CLINICAL DATA:  51 year old male status post fall on steps striking left eye on door handle. EXAM: ORBITS - COMPLETE 4+ VIEW COMPARISON:  None. FINDINGS: Bone mineralization is within normal limits. There is no evidence of fracture or other significant bone abnormality. No orbital emphysema or sinus air-fluid levels are seen. Sinuses appear symmetrically and normally aerated. Multifocal bulky, round, stippled calcifications in the left upper neck (image 3, arrow) individually up to 13 mm diameter, indeterminate for bulky carotid calcified atherosclerosis versus sialolithiasis. IMPRESSION: 1. No acute fracture or dislocation identified about the face. If pain persist noncontrast Face CT would  be most sensitive for occult fracture. 2. Bulky left neck calcifications could be carotid atherosclerosis or sialolithiasis. Electronically Signed   By: Odessa Fleming M.D.   On: 07/26/2021 09:30    Procedures Procedures (including critical care time)  Medications Ordered in UC Medications  Tdap (BOOSTRIX) injection 0.5 mL (has no administration in time range)    Initial Impression / Assessment and Plan / UC Course  I have reviewed the triage vital signs and the nursing notes.  Pertinent labs & imaging results that were available during my care of the patient were reviewed by me and considered in my medical decision making (see chart for details).     Cleaned the dried blood of mostly. The abrasion on the center of the hematoma does not need suturing. After the x-ray that abrasion was using again.  Silver nitrate sticks were used to achieve hemostasis and cautery  Xray does not show fracture Final Clinical Impressions(s) / UC Diagnoses   Final diagnoses:  Hematoma of face, initial encounter     Discharge Instructions      Garan should not take his baby aspirin for 5 days, and then he can restart the baby aspirin 1 daily.  Use Neosporin antibiotic ointment on the cuts twice a day until healed.  Use ice packs on the swollen area under  his left eye for 2 to 3 days.  He should sleep with his head elevated on 2-3 pillows.  Tylenol 500 mg 2 every 4 hours as needed for pain.     ED Prescriptions   None    PDMP not reviewed this encounter.   Zenia ResidesBanister, Jaylee Lantry K, MD 07/26/21 [phone]

## 2021-07-26 NOTE — Discharge Instructions (Addendum)
Ryan Case should not take his baby aspirin for 5 days, and then he can restart the baby aspirin 1 daily.  Use Neosporin antibiotic ointment on the cuts twice a day until healed.  Use ice packs on the swollen area under his left eye for 2 to 3 days.  He should sleep with his head elevated on 2-3 pillows.  Tylenol 500 mg 2 every 4 hours as needed for pain.

## 2021-08-02 ENCOUNTER — Other Ambulatory Visit: Payer: Self-pay

## 2021-08-02 ENCOUNTER — Ambulatory Visit (INDEPENDENT_AMBULATORY_CARE_PROVIDER_SITE_OTHER): Payer: Medicare Other | Admitting: Podiatry

## 2021-08-02 ENCOUNTER — Encounter: Payer: Self-pay | Admitting: Podiatry

## 2021-08-02 DIAGNOSIS — M2041 Other hammer toe(s) (acquired), right foot: Secondary | ICD-10-CM

## 2021-08-02 DIAGNOSIS — M2042 Other hammer toe(s) (acquired), left foot: Secondary | ICD-10-CM | POA: Diagnosis not present

## 2021-08-02 DIAGNOSIS — B351 Tinea unguium: Secondary | ICD-10-CM | POA: Diagnosis not present

## 2021-08-02 DIAGNOSIS — M79674 Pain in right toe(s): Secondary | ICD-10-CM

## 2021-08-02 DIAGNOSIS — M79675 Pain in left toe(s): Secondary | ICD-10-CM | POA: Diagnosis not present

## 2021-08-02 DIAGNOSIS — E119 Type 2 diabetes mellitus without complications: Secondary | ICD-10-CM

## 2021-08-02 NOTE — Progress Notes (Signed)
ANNUAL DIABETIC FOOT EXAM  Subjective: Ryan Case presents today for for annual diabetic foot examination. He is accompanied by his group home caregiver, Mr. Madilyn Fireman, on today's visit.  Mr. Madilyn Fireman denies any h/o diabetic foot wounds.  Patient has been diagnosed with neuropathy and it is managed with gabapentin.  Risk factors: diabetes, h/o MI.  [name], MD is patient's PCP. Last visit was 3 months ago.  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 No pertinent surgical history. Current Outpatient Medications on File Prior to Visit  Medication Sig Dispense Refill   albuterol (VENTOLIN HFA) 108 (90 Base) MCG/ACT inhaler Inhale 2 puffs into the lungs every 4 (four) hours as needed.     amitriptyline (ELAVIL) 25 MG tablet Take 25 mg by mouth at bedtime.       amoxicillin (AMOXIL) 500 MG capsule Take 500 mg by mouth 2 (two) times daily.     aspirin 81 MG tablet Take 81 mg by mouth daily.       benztropine (COGENTIN) 1 MG tablet Take 1 mg by mouth daily.       Betamethasone Valerate (LUXIQ EX) Apply 17 % topically as needed.       carvedilol (COREG) 3.125 MG tablet Take 3.125 mg by mouth 2 (two) times daily with a meal.       ciclopirox (LOPROX) 0.77 % cream Apply topically as needed.        desmopressin (DDAVP) 0.2 MG tablet TAKE TWO TABLETS EVERY DAY 180 tablet 3   Dextrose, Diabetic Use, (GLUCOSE PO) Take by mouth.       digoxin (LANOXIN) 0.25 MG tablet Take 250 mcg by mouth daily.       famotidine (PEPCID) 20 MG tablet Take 20 mg by mouth daily.       fenofibrate (TRICOR) 145 MG tablet Take 48 mg by mouth daily.      furosemide (LASIX) 40 MG tablet Take 40 mg by mouth daily.       gabapentin (NEURONTIN) 400 MG capsule Take 400 mg by mouth 3 (three) times daily. Takes all three capsules at bedtime.     glipiZIDE (GLIPIZIDE XL) 10 MG 24 hr tablet Take 20 mg by mouth daily.       glipiZIDE-metformin (METAGLIP) 5-500 MG tablet Take 1 tablet by mouth daily.     Loratadine 10 MG CAPS Take by mouth daily. Taking PRN.     LORazepam (ATIVAN) 1 MG tablet Take 1 mg by mouth daily.      LORAZEPAM PO Take 0.5 mg by mouth daily.      Metoclopramide HCl 10 MG TBDP Take 1 tablet by mouth 2 (two) times daily.       neomycin-polymyxin-pramoxine (NEOSPORIN PLUS) 1 %  cream Apply to left foot lacerations once daily until healed. 14.2 g 0   NOREL AD 4-10-325 MG TABS Take 1 tablet by mouth 4 (four) times daily.     OMEPRAZOLE PO Take 20 mg by mouth daily.       potassium chloride (KLOR-CON) 10 MEQ tablet Take 20 mEq by mouth daily.     POTASSIUM CHLORIDE CR PO Take 10 mg by mouth 2 (two) times daily.       Promethazine HCl 6.25 MG/5ML SOLN SMARTSIG:1 Teaspoon By Mouth Every 4-6 Hours PRN     QC LO-DOSE ASPIRIN 81 MG EC tablet Take 81 mg by mouth daily.     risperiDONE (RISPERDAL) 3 MG tablet Take 3 mg by mouth 2 (two) times daily.       simvastatin (ZOCOR) 20 MG tablet Take 20 mg by mouth daily.     tamsulosin (FLOMAX) 0.4 MG CAPS capsule Take 0.4 mg by mouth daily.     theophylline (THEODUR) 200 MG 12 hr tablet Take 200 mg by mouth 2 (two) times daily.       theophylline (UNIPHYL) 400 MG 24 hr tablet Take 400 mg by mouth daily.     tolnaftate (TINACTIN) 1 % spray Apply topically 2 (two) times  daily.     Transparent Dressings (NEXCARE TEGADERM 4"X4-3/4") MISC Apply topically as directed.     traZODone (DESYREL) 50 MG tablet      No current facility-administered medications on file prior to visit.    No Known Allergies Social History   Occupational History   Not on file  Tobacco Use   Smoking status: Former   Smokeless tobacco: Never  Substance and Sexual Activity   Alcohol use: No   Drug use: No   Sexual activity: Not on file   Family History  Problem Relation Age of Onset   Arthritis Other    Cancer Other    Diabetes Other    Hypertension Other    Gout Other    Immunization History  Administered Date(s) Administered   Tdap 07/26/2021     Review of Systems: Negative except as noted in the HPI.   Objective: There were no vitals filed for this visit.  Ryan Case is a pleasant 51 y.o. male in NAD. AAO X 3.  Vascular Examination: CFT immediate b/l LE. Palpable DP/PT pulses b/l LE. Digital hair sparse b/l. Skin temperature gradient WNL b/l. No pain with calf compression b/l. No edema noted b/l. No cyanosis or clubbing noted b/l LE.  Dermatological Examination: Pedal skin is warm and supple b/l LE. Toenails 1-5 b/l elongated, discolored, dystrophic, thickened, crumbly with subungual debris and tenderness to dorsal palpation. No hyperkeratotic nor porokeratotic lesions present on today's visit.  Musculoskeletal Examination: Normal muscle strength 5/5 to all lower extremity muscle groups bilaterally. Hammertoe deformity noted 2-5 b/l.Marland Kitchen No pain, crepitus or joint limitation noted with ROM b/l LE.  Patient ambulates independently without assistive aids.  Footwear Assessment: Does the patient wear appropriate shoes? Yes. Does the patient need inserts/orthotics? No.  Neurological Examination: Protective sensation intact 5/5 intact bilaterally with 10g monofilament b/l.  Assessment: 1. Pain due to onychomycosis of toenails of both feet   2. Acquired  hammertoes of both feet   3. Type 2 diabetes mellitus without complication, without long-term current use of insulin (HCC)   4. Encounter for diabetic foot exam (HCC)     ADA Risk Categorization: Low Risk :  Patient has all of the following: Intact protective sensation  No prior foot ulcer  No severe deformity Pedal pulses present  Plan: -Diabetic foot examination performed today. -Continue foot and shoe inspections daily. Monitor blood glucose per PCP/Endocrinologist's recommendations. -Mycotic toenails 1-5 bilaterally were debrided in length and girth with sterile nail nippers and dremel without incident. -Patient/POA to call should there be question/concern in the interim.  Return in about 3 months (around 10/31/2021).  Freddie BreechJennifer L Garrit Marrow, DPM

## 2021-11-02 ENCOUNTER — Ambulatory Visit (INDEPENDENT_AMBULATORY_CARE_PROVIDER_SITE_OTHER): Payer: Medicare Other | Admitting: Podiatry

## 2021-11-02 ENCOUNTER — Encounter: Payer: Self-pay | Admitting: Podiatry

## 2021-11-02 DIAGNOSIS — B351 Tinea unguium: Secondary | ICD-10-CM | POA: Diagnosis not present

## 2021-11-02 DIAGNOSIS — L03032 Cellulitis of left toe: Secondary | ICD-10-CM | POA: Diagnosis not present

## 2021-11-02 DIAGNOSIS — M79674 Pain in right toe(s): Secondary | ICD-10-CM | POA: Diagnosis not present

## 2021-11-02 DIAGNOSIS — M79675 Pain in left toe(s): Secondary | ICD-10-CM | POA: Diagnosis not present

## 2021-11-02 DIAGNOSIS — E119 Type 2 diabetes mellitus without complications: Secondary | ICD-10-CM | POA: Diagnosis not present

## 2021-11-02 DIAGNOSIS — L600 Ingrowing nail: Secondary | ICD-10-CM | POA: Diagnosis not present

## 2021-11-02 MED ORDER — DOXYCYCLINE HYCLATE 100 MG PO TABS: 100.0000 mg | ORAL_TABLET | Freq: Two times a day (BID) | ORAL | 0 refills | Status: AC

## 2021-11-02 NOTE — Progress Notes (Signed)
?  Subjective:  ?Patient ID: Ryan Case, male    DOB: 1970-11-12,  MRN: [id number] ? ?Ryan Case presents to clinic today for preventative diabetic foot care and painful elongated mycotic toenails 1-5 bilaterally which are tender when wearing enclosed shoe gear. Pain is relieved with periodic professional debridement. ? ?Ryan Case lives in a group home and is accompanied by caregiver, Ryan Case. ? ?Ryan Case states Ryan Case has been picking at his left great toe. Ryan Case states he has pain in the toe. He has h/o  kicking walls and doors when he is upset and may have injured his toenail during an episode. He cannot relate any trauma to toe. ? ?Patient did not check blood glucose today. ? ?PCP is Ryan Landsman, MD ? ?No Known Allergies ? ?Review of Systems: Negative except as noted in the HPI. ? ?Objective: ? ?There were no vitals filed for this visit. ? ?Ryan Case is a pleasant 51 y.o. male in NAD. AAO X 3. ? ?Vascular Examination: ?CFT immediate b/l LE. Palpable DP/PT pulses b/l LE. Digital hair sparse b/l. Skin temperature gradient WNL b/l. No pain with calf compression b/l. No edema noted b/l. No cyanosis or clubbing noted b/l LE. ? ?Dermatological Examination: ?Pedal skin is warm and supple b/l LE. Toenails 2-5 b/l and right great toe elongated, discolored, dystrophic, thickened, crumbly with subungual debris and tenderness to dorsal palpation. No hyperkeratotic nor porokeratotic lesions present on today's visit. ? ?Short incurvated nailplate left great toe bilateral border(s) with tenderness to palpation. No edema, no drainage noted.  He seems to have a subacute process with dried drainage of medial border. It is tender to palpation. No purulence expressed. Proximal nail border with erythema. No odor. ? ?Musculoskeletal Examination: ?Normal muscle strength 5/5 to all lower extremity muscle groups bilaterally. Hammertoe deformity noted 2-5 b/l.Marland Kitchen No pain, crepitus or joint limitation noted with ROM  b/l LE.  Patient ambulates independently without assistive aids. ? ?Neurological Examination: ?Protective sensation intact 5/5 intact bilaterally with 10g monofilament b/l. ?Assessment/Plan: ?1. Pain due to onychomycosis of toenails of both feet   ?2. Ingrown nail of great toe of left foot   ?3. Paronychia of great toe of left foot   ?4. Type 2 diabetes mellitus without complication, without long-term current use of insulin ([place])   ?  ?-Patient was evaluated and treated. All patient's and/or POA's questions/concerns answered on today's visit. ?-Ryan Case appears to have a subacute paronychia occurring of the left great toe. I will place him on doxycycline 100 mg po bid x 7 days and refer him to Dr. Paulla Dolly or Dr. Posey Pronto for ingrown toenail removal. ?-Mycotic toenails 2-5 bilaterally and R hallux were debrided in length and girth with sterile nail nippers and dremel without iatrogenic bleeding. ?-Patient/POA to call should there be question/concern in the interim.  ?-Return within the week to see Dr. Posey Pronto or Dr. Paulla Dolly for left ingrown toenail. Return to see me in about 3 months (around 02/01/2022). ? ?Marzetta Board, DPM  ?

## 2021-11-07 ENCOUNTER — Ambulatory Visit (INDEPENDENT_AMBULATORY_CARE_PROVIDER_SITE_OTHER): Payer: Medicare Other | Admitting: Podiatry

## 2021-11-07 ENCOUNTER — Encounter: Payer: Self-pay | Admitting: Podiatry

## 2021-11-07 DIAGNOSIS — L03032 Cellulitis of left toe: Secondary | ICD-10-CM | POA: Diagnosis not present

## 2021-11-07 NOTE — Patient Instructions (Signed)

## 2021-11-08 NOTE — Progress Notes (Signed)
Subjective:  ? ?Patient ID: Ryan Case, male   DOB: 51 y.o.   MRN: [id number]  ? ?HPI ?Patient presents with caregiver with a moderate grade infection of both the medial and lateral border of the left hallux that involves 2 borders.  It is localized to this area patient is not a good historian presents with caregiver ? ? ?ROS ? ? ?   ?Objective:  ?Physical Exam  ?Neurovascular status intact muscle strength found to be adequate with patient noted to have inflammation redness of the medial and lateral border of the left hallux that is localized no proximal edema erythema or drainage was noted currently ? ?   ?Assessment:  ?Paronychia infection x2 left ? ?   ?Plan:  ?Reviewed condition discussed with him and caregiver and I went ahead and anesthetized the left hallux 60 mg like Marcaine mixture sterile prep done to the toe and using sterile instrumentation I remove the medial and lateral border I flushed both beds debrided necrotic tissue applied sterile dressing.  Gave instructions for soaks reappoint to recheck and encouraged them to call if any changes were to occur ?   ? ? ?

## 2022-01-20 IMAGING — DX DG ORBITS COMPLETE 4+V
3 series · 3 of 3 positions shown · non-contrast
Comparison: None.

CLINICAL DATA: 50-year-old male status post fall on steps striking
left eye on door handle.

EXAM:
ORBITS - COMPLETE 4+ VIEW

[skull calldwell]
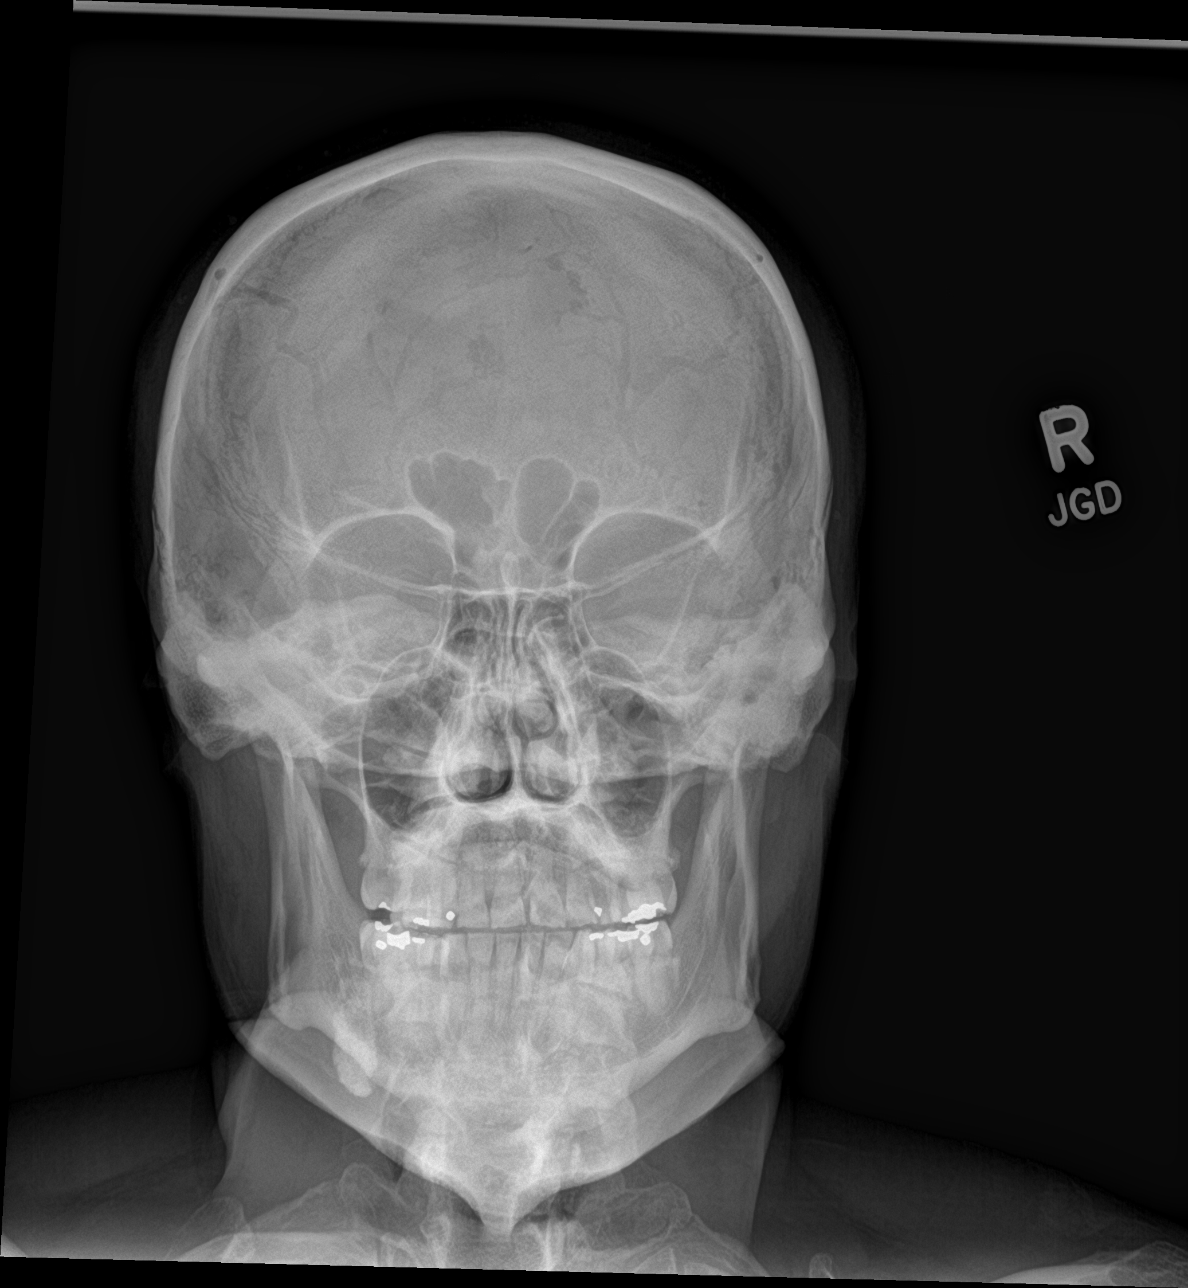

[skull lat]
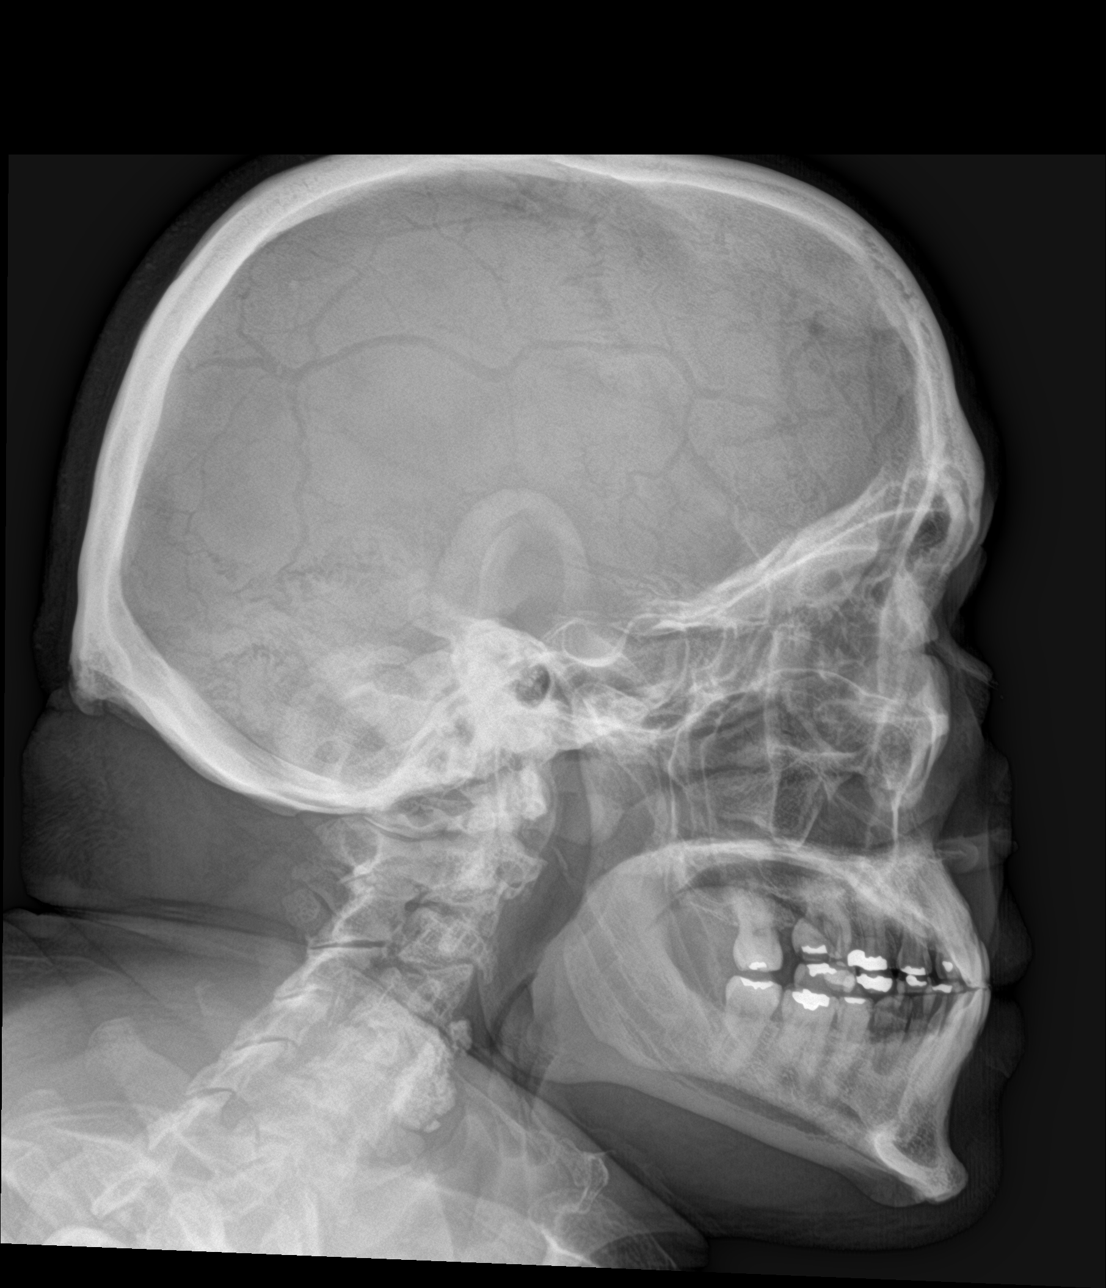

[skull waters]
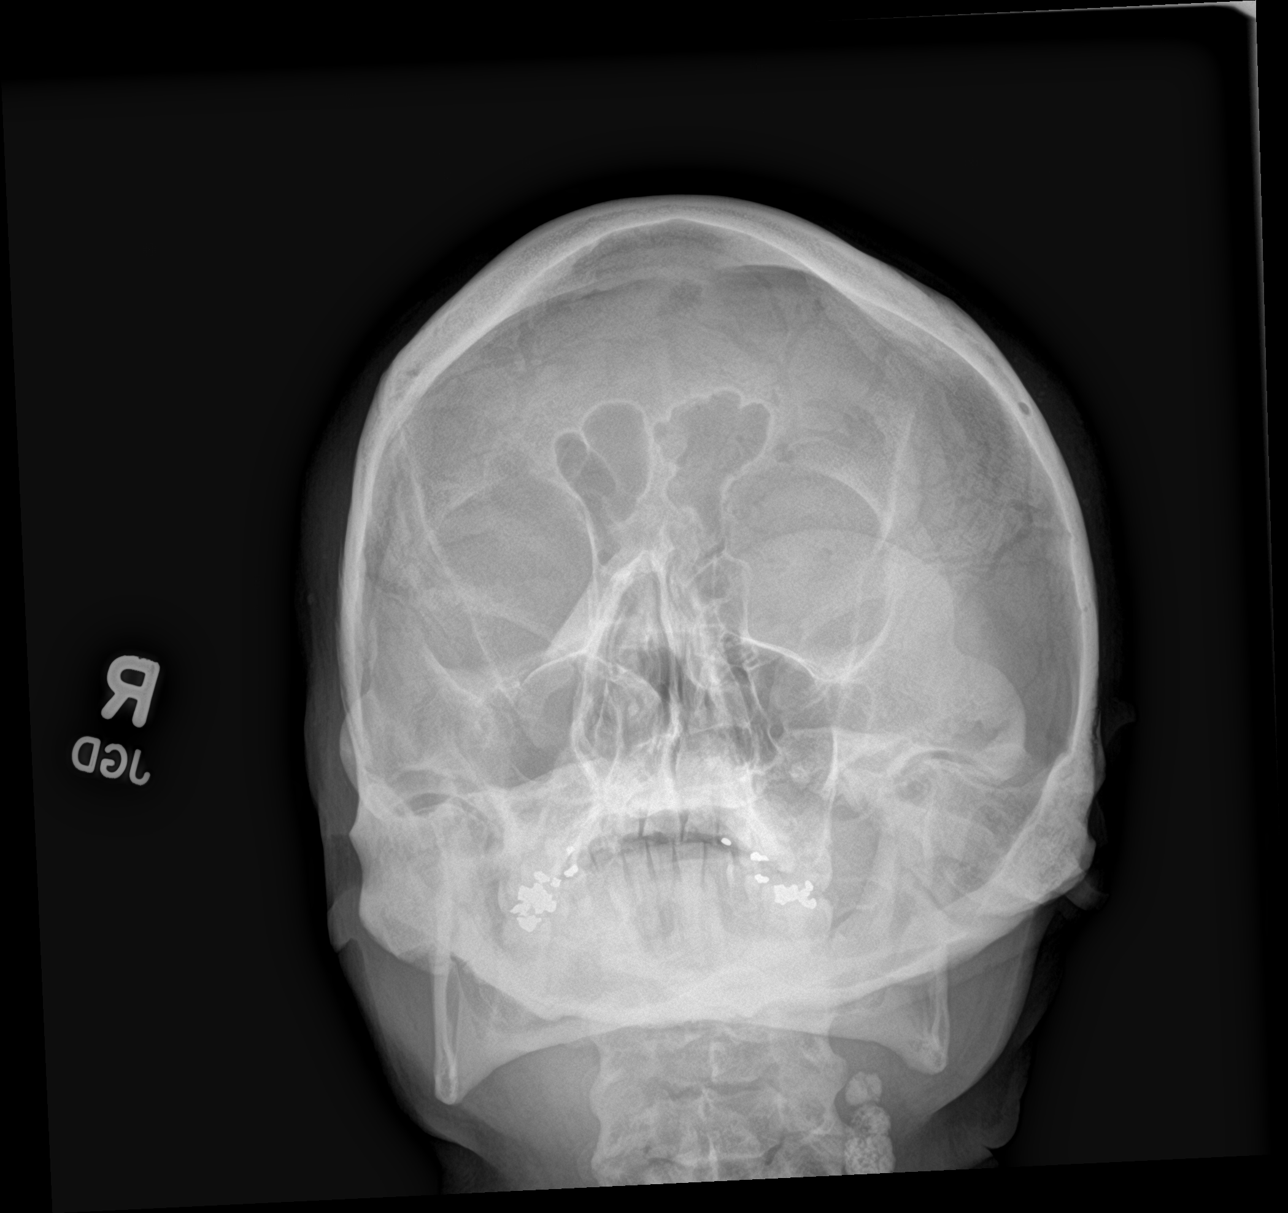

[3 of 3 positions shown; findings below may reference images not displayed]

FINDINGS: Bone mineralization is within normal limits. There is no evidence of
fracture or other significant bone abnormality. No orbital emphysema
or sinus air-fluid levels are seen. Sinuses appear symmetrically and
normally aerated. Multifocal bulky, round, stippled calcifications
in the left upper neck (image 3, arrow) individually up to 13 mm
diameter, indeterminate for bulky carotid calcified atherosclerosis
versus sialolithiasis.
IMPRESSION: 1. No acute fracture or dislocation identified about the face. If
pain persist noncontrast Face CT would be most sensitive for occult
fracture.
2. Bulky left neck calcifications could be carotid atherosclerosis
or sialolithiasis.

## 2022-02-08 ENCOUNTER — Ambulatory Visit (INDEPENDENT_AMBULATORY_CARE_PROVIDER_SITE_OTHER): Payer: Medicare Other | Admitting: Podiatry

## 2022-02-08 ENCOUNTER — Encounter: Payer: Self-pay | Admitting: Podiatry

## 2022-02-08 DIAGNOSIS — M79675 Pain in left toe(s): Secondary | ICD-10-CM

## 2022-02-08 DIAGNOSIS — B351 Tinea unguium: Secondary | ICD-10-CM

## 2022-02-08 DIAGNOSIS — M79674 Pain in right toe(s): Secondary | ICD-10-CM

## 2022-02-08 DIAGNOSIS — E119 Type 2 diabetes mellitus without complications: Secondary | ICD-10-CM | POA: Diagnosis not present

## 2022-02-14 NOTE — Progress Notes (Signed)
  Subjective:  Patient ID: Ryan Case, male    DOB: 04-Feb-1971,  MRN: [id number]  Ryan Case presents to clinic today for preventative diabetic foot care and painful thick toenails that are difficult to trim. Pain interferes with ambulation. Aggravating factors include wearing enclosed shoe gear. Pain is relieved with periodic professional debridement.  Patient is accompanied by his caregiver, Ryan Case, on today's visit. Ryan Case states Dr. Charlsie Merles removed Ryan Case ingrown toenail and has recovered from that.  New problem(s): None.   PCP is [name], MD , and last visit was  February, 2023.  No Known Allergies  Review of Systems: Negative except as noted in the HPI.  Objective: No changes noted in today's physical examination. There were no vitals filed for this visit.  Ryan Case is a pleasant 51 y.o. male in NAD. AAO X 3.  Vascular Examination: CFT immediate b/l LE. Palpable DP/PT pulses b/l LE. Digital hair sparse b/l. Skin temperature gradient WNL b/l. No pain with calf compression b/l. No edema noted b/l. No cyanosis or clubbing noted b/l LE.  Dermatological Examination: Pedal skin is warm and supple b/l LE. Toenails 2-5 b/l and right great toe elongated, discolored, dystrophic, thickened, crumbly with subungual debris and tenderness to dorsal palpation. No hyperkeratotic nor porokeratotic lesions present on today's visit.  Left great toenail with evidence of partial matrixectomies both borders. Completely healed.  Musculoskeletal Examination: Normal muscle strength 5/5 to all lower extremity muscle groups bilaterally. Hammertoe deformity noted 2-5 b/l.Marland Kitchen No pain, crepitus or joint limitation noted with ROM b/l LE.  Patient ambulates independently without assistive aids.  Neurological Examination: Protective sensation intact 5/5 intact bilaterally with 10g monofilament b/l.  Assessment/Plan: 1. Pain due to onychomycosis of toenails of both feet   2. Type  2 diabetes mellitus without complication, without long-term current use of insulin [facility])   -Facility staff present with patient.  All questions/concerns addressed on today's visit. -Examined patient. -Patient to continue soft, supportive shoe gear daily. -Toenails 1-5 b/l were debrided in length and girth with sterile nail nippers and dremel without iatrogenic bleeding.  -Patient/POA to call should there be question/concern in the interim.   Return in about 3 months (around 05/11/2022).  Freddie Breech, DPM

## 2022-04-20 ENCOUNTER — Encounter (HOSPITAL_COMMUNITY): Payer: Self-pay | Admitting: Emergency Medicine

## 2022-04-20 ENCOUNTER — Other Ambulatory Visit: Payer: Self-pay

## 2022-04-20 ENCOUNTER — Ambulatory Visit (HOSPITAL_COMMUNITY)
Admission: EM | Admit: 2022-04-20 | Discharge: 2022-04-20 | Disposition: A | Discharge status: Home / Self Care | Payer: Medicare Other

## 2022-04-20 DIAGNOSIS — M545 Low back pain, unspecified: Secondary | ICD-10-CM | POA: Diagnosis not present
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
## 2022-05-22 ENCOUNTER — Ambulatory Visit (INDEPENDENT_AMBULATORY_CARE_PROVIDER_SITE_OTHER): Payer: Self-pay | Admitting: Podiatry

## 2022-05-22 DIAGNOSIS — Z91199 Patient's noncompliance with other medical treatment and regimen due to unspecified reason: Secondary | ICD-10-CM

## 2022-05-23 NOTE — Progress Notes (Signed)
1. No-show for appointment     

## 2022-07-12 ENCOUNTER — Ambulatory Visit (INDEPENDENT_AMBULATORY_CARE_PROVIDER_SITE_OTHER): Payer: Medicare Other | Admitting: Podiatry

## 2022-07-12 ENCOUNTER — Encounter: Payer: Self-pay | Admitting: Podiatry

## 2022-07-12 DIAGNOSIS — M79675 Pain in left toe(s): Secondary | ICD-10-CM | POA: Diagnosis not present

## 2022-07-12 DIAGNOSIS — B351 Tinea unguium: Secondary | ICD-10-CM

## 2022-07-12 DIAGNOSIS — M79674 Pain in right toe(s): Secondary | ICD-10-CM | POA: Diagnosis not present

## 2022-07-12 DIAGNOSIS — E119 Type 2 diabetes mellitus without complications: Secondary | ICD-10-CM

## 2022-07-12 NOTE — Progress Notes (Signed)
This patient returns to my office for at risk foot care.  This patient requires this care by a professional since this patient will be at risk due to having diabetes.  This patient is unable to cut nails himself since the patient cannot reach his nails.These nails are painful walking and wearing shoes. He presents with his caregiver. This patient presents for at risk foot care today.  General Appearance  Alert, conversant and in no acute stress.  Vascular  Dorsalis pedis and posterior tibial  pulses are palpable  bilaterally.  Capillary return is within normal limits  bilaterally. Temperature is within normal limits  bilaterally.  Neurologic  Senn-Weinstein monofilament wire test within normal limits  bilaterally. Muscle power within normal limits bilaterally.  Nails Thick disfigured discolored nails with subungual debris  from hallux to fifth toes bilaterally. No evidence of bacterial infection or drainage bilaterally.  Orthopedic  No limitations of motion  feet .  No crepitus or effusions noted.  No bony pathology or digital deformities noted.  Skin  normotropic skin with no porokeratosis noted bilaterally.  No signs of infections or ulcers noted.     Onychomycosis  Pain in right toes  Pain in left toes  Consent was obtained for treatment procedures.   Mechanical debridement of nails 1-5  bilaterally performed with a nail nipper.  Filed with dremel without incident.    Return office visit     3 months                 Told patient to return for periodic foot care and evaluation due to potential at risk complications.   Gardiner Barefoot DPM

## 2022-07-15 ENCOUNTER — Emergency Department (HOSPITAL_COMMUNITY): Payer: Medicare Other

## 2022-07-15 ENCOUNTER — Ambulatory Visit (HOSPITAL_COMMUNITY)
Admission: EM | Admit: 2022-07-15 | Discharge: 2022-07-15 | Disposition: A | Discharge status: Home / Self Care | Payer: Medicare Other | Attending: Internal Medicine | Admitting: Internal Medicine

## 2022-07-15 ENCOUNTER — Encounter (HOSPITAL_COMMUNITY): Payer: Self-pay | Admitting: Pharmacy Technician

## 2022-07-15 ENCOUNTER — Other Ambulatory Visit: Payer: Self-pay

## 2022-07-15 ENCOUNTER — Observation Stay (HOSPITAL_COMMUNITY)
Admission: EM | Admit: 2022-07-15 | Discharge: 2022-07-17 | Disposition: A | Discharge status: Home / Self Care | Payer: Medicare Other | Attending: Internal Medicine | Admitting: Internal Medicine

## 2022-07-15 DIAGNOSIS — R9431 Abnormal electrocardiogram [ECG] [EKG]: Secondary | ICD-10-CM | POA: Diagnosis not present

## 2022-07-15 DIAGNOSIS — Z7984 Long term (current) use of oral hypoglycemic drugs: Secondary | ICD-10-CM | POA: Insufficient documentation

## 2022-07-15 DIAGNOSIS — G9341 Metabolic encephalopathy: Secondary | ICD-10-CM | POA: Diagnosis not present

## 2022-07-15 DIAGNOSIS — Z79899 Other long term (current) drug therapy: Secondary | ICD-10-CM | POA: Insufficient documentation

## 2022-07-15 DIAGNOSIS — E119 Type 2 diabetes mellitus without complications: Secondary | ICD-10-CM | POA: Diagnosis not present

## 2022-07-15 DIAGNOSIS — R2681 Unsteadiness on feet: Secondary | ICD-10-CM | POA: Diagnosis not present

## 2022-07-15 DIAGNOSIS — Z1152 Encounter for screening for COVID-19: Secondary | ICD-10-CM | POA: Diagnosis not present

## 2022-07-15 DIAGNOSIS — A419 Sepsis, unspecified organism: Secondary | ICD-10-CM | POA: Insufficient documentation

## 2022-07-15 DIAGNOSIS — N179 Acute kidney failure, unspecified: Secondary | ICD-10-CM | POA: Diagnosis not present

## 2022-07-15 DIAGNOSIS — R6511 Systemic inflammatory response syndrome (SIRS) of non-infectious origin with acute organ dysfunction: Secondary | ICD-10-CM | POA: Insufficient documentation

## 2022-07-15 DIAGNOSIS — J1089 Influenza due to other identified influenza virus with other manifestations: Secondary | ICD-10-CM | POA: Insufficient documentation

## 2022-07-15 DIAGNOSIS — R2689 Other abnormalities of gait and mobility: Secondary | ICD-10-CM | POA: Diagnosis not present

## 2022-07-15 DIAGNOSIS — R4182 Altered mental status, unspecified: Secondary | ICD-10-CM

## 2022-07-15 DIAGNOSIS — Z7982 Long term (current) use of aspirin: Secondary | ICD-10-CM | POA: Insufficient documentation

## 2022-07-15 DIAGNOSIS — I5030 Unspecified diastolic (congestive) heart failure: Secondary | ICD-10-CM | POA: Diagnosis not present

## 2022-07-15 DIAGNOSIS — G4730 Sleep apnea, unspecified: Secondary | ICD-10-CM | POA: Diagnosis present

## 2022-07-15 DIAGNOSIS — J101 Influenza due to other identified influenza virus with other respiratory manifestations: Secondary | ICD-10-CM | POA: Diagnosis present

## 2022-07-15 DIAGNOSIS — M6281 Muscle weakness (generalized): Secondary | ICD-10-CM | POA: Diagnosis not present

## 2022-07-15 LAB — CBC WITH DIFFERENTIAL/PLATELET
Abs Immature Granulocytes: 0.07 10*3/uL (ref 0.00–0.07)
Basophils Absolute: 0 10*3/uL (ref 0.0–0.1)
Basophils Relative: 0 %
Eosinophils Absolute: 0 10*3/uL (ref 0.0–0.5)
Eosinophils Relative: 0 %
HCT: 44.5 % (ref 39.0–52.0)
Hemoglobin: 15.3 g/dL (ref 13.0–17.0)
Immature Granulocytes: 1 %
Lymphocytes Relative: 6 %
Lymphs Abs: 0.7 10*3/uL (ref 0.7–4.0)
MCH: 29.5 pg (ref 26.0–34.0)
MCHC: 34.4 g/dL (ref 30.0–36.0)
MCV: 85.9 fL (ref 80.0–100.0)
Monocytes Absolute: 2.2 10*3/uL — ABNORMAL HIGH (ref 0.1–1.0)
Monocytes Relative: 20 %
Neutro Abs: 8.1 10*3/uL — ABNORMAL HIGH (ref 1.7–7.7)
Neutrophils Relative %: 73 %
Platelets: 171 10*3/uL (ref 150–400)
RBC: 5.18 MIL/uL (ref 4.22–5.81)
RDW: 12.3 % (ref 11.5–15.5)
WBC: 11 10*3/uL — ABNORMAL HIGH (ref 4.0–10.5)
nRBC: 0 % (ref 0.0–0.2)

## 2022-07-15 LAB — URINALYSIS, ROUTINE W REFLEX MICROSCOPIC
Bacteria, UA: NONE SEEN
Bilirubin Urine: NEGATIVE
Glucose, UA: NEGATIVE mg/dL
Ketones, ur: NEGATIVE mg/dL
Leukocytes,Ua: NEGATIVE
Nitrite: NEGATIVE
Protein, ur: NEGATIVE mg/dL
Specific Gravity, Urine: 1.003 — ABNORMAL LOW (ref 1.005–1.030)
pH: 7 (ref 5.0–8.0)

## 2022-07-15 LAB — COMPREHENSIVE METABOLIC PANEL
ALT: 26 U/L (ref 0–44)
AST: 45 U/L — ABNORMAL HIGH (ref 15–41)
Albumin: 3.6 g/dL (ref 3.5–5.0)
Alkaline Phosphatase: 74 U/L (ref 38–126)
Anion gap: 13 (ref 5–15)
BUN: 28 mg/dL — ABNORMAL HIGH (ref 6–20)
CO2: 25 mmol/L (ref 22–32)
Calcium: 8.4 mg/dL — ABNORMAL LOW (ref 8.9–10.3)
Chloride: 102 mmol/L (ref 98–111)
Creatinine, Ser: 1.86 mg/dL — ABNORMAL HIGH (ref 0.61–1.24)
GFR, Estimated: 43 mL/min — ABNORMAL LOW (ref >60)
Glucose, Bld: 118 mg/dL — ABNORMAL HIGH (ref 70–99)
Potassium: 4.5 mmol/L (ref 3.5–5.1)
Sodium: 140 mmol/L (ref 135–145)
Total Bilirubin: 0.6 mg/dL (ref 0.3–1.2)
Total Protein: 7.2 g/dL (ref 6.5–8.1)

## 2022-07-15 LAB — I-STAT CHEM 8, ED
BUN: 31 mg/dL — ABNORMAL HIGH (ref 6–20)
Calcium, Ion: 1.01 mmol/L — ABNORMAL LOW (ref 1.15–1.40)
Chloride: 104 mmol/L (ref 98–111)
Creatinine, Ser: 2 mg/dL — ABNORMAL HIGH (ref 0.61–1.24)
Glucose, Bld: 117 mg/dL — ABNORMAL HIGH (ref 70–99)
HCT: 44 % (ref 39.0–52.0)
Hemoglobin: 15 g/dL (ref 13.0–17.0)
Potassium: 4.4 mmol/L (ref 3.5–5.1)
Sodium: 142 mmol/L (ref 135–145)
TCO2: 30 mmol/L (ref 22–32)

## 2022-07-15 LAB — APTT: aPTT: 32 seconds (ref 24–36)

## 2022-07-15 LAB — LACTIC ACID, PLASMA
Lactic Acid, Venous: 2.5 mmol/L (ref 0.5–1.9)
Lactic Acid, Venous: 3.8 mmol/L (ref 0.5–1.9)
Lactic Acid, Venous: 2.3 mmol/L (ref 0.5–1.9)

## 2022-07-15 LAB — HIV ANTIBODY (ROUTINE TESTING W REFLEX): HIV Screen 4th Generation wRfx: NONREACTIVE

## 2022-07-15 LAB — PROTIME-INR
INR: 1.1 (ref 0.8–1.2)
Prothrombin Time: 13.7 seconds (ref 11.4–15.2)

## 2022-07-15 LAB — RESP PANEL BY RT-PCR (RSV, FLU A&B, COVID)  RVPGX2
Influenza A by PCR: NEGATIVE
Influenza B by PCR: POSITIVE — AB
Resp Syncytial Virus by PCR: NEGATIVE
SARS Coronavirus 2 by RT PCR: NEGATIVE

## 2022-07-15 LAB — TROPONIN I (HIGH SENSITIVITY)
Troponin I (High Sensitivity): 22 ng/L — ABNORMAL HIGH (ref <18)
Troponin I (High Sensitivity): 19 ng/L — ABNORMAL HIGH (ref <18)

## 2022-07-15 LAB — GLUCOSE, CAPILLARY
Glucose-Capillary: 68 mg/dL — ABNORMAL LOW (ref 70–99)
Glucose-Capillary: 103 mg/dL — ABNORMAL HIGH (ref 70–99)

## 2022-07-15 LAB — CBG MONITORING, ED: Glucose-Capillary: 99 mg/dL (ref 70–99)

## 2022-07-15 LAB — PROCALCITONIN: Procalcitonin: 0.68 ng/mL

## 2022-07-15 MED ORDER — LACTATED RINGERS IV BOLUS
1000.0000 mL | Freq: Once | INTRAVENOUS | Status: AC
  Administered 2022-07-15: 1000 mL via INTRAVENOUS

## 2022-07-15 MED ORDER — RISPERIDONE 3 MG PO TABS
3.0000 mg | ORAL_TABLET | Freq: Two times a day (BID) | ORAL | Status: DC
  Administered 2022-07-15 – 2022-07-17 (×4): 3 mg via ORAL
  Filled 2022-07-15 (×6): qty 1

## 2022-07-15 MED ORDER — SODIUM CHLORIDE 0.9 % IV SOLN
2.0000 g | Freq: Once | INTRAVENOUS | Status: AC
  Administered 2022-07-15: 2 g via INTRAVENOUS
  Filled 2022-07-15: qty 12.5

## 2022-07-15 MED ORDER — DIGOXIN 125 MCG PO TABS: 250.0000 ug | ORAL_TABLET | Freq: Every day | ORAL | Status: DC

## 2022-07-15 MED ORDER — OSELTAMIVIR PHOSPHATE 30 MG PO CAPS
30.0000 mg | ORAL_CAPSULE | Freq: Two times a day (BID) | ORAL | Status: DC
  Administered 2022-07-15 – 2022-07-17 (×4): 30 mg via ORAL
  Filled 2022-07-15 (×5): qty 1

## 2022-07-15 MED ORDER — SODIUM CHLORIDE 0.9 % IV SOLN: 2.0000 g | Freq: Two times a day (BID) | INTRAVENOUS | Status: DC

## 2022-07-15 MED ORDER — RIVAROXABAN 10 MG PO TABS
10.0000 mg | ORAL_TABLET | Freq: Every day | ORAL | Status: DC
  Administered 2022-07-15 – 2022-07-17 (×3): 10 mg via ORAL
  Filled 2022-07-15 (×3): qty 1

## 2022-07-15 MED ORDER — VANCOMYCIN HCL 750 MG/150ML IV SOLN: 750.0000 mg | INTRAVENOUS | Status: DC

## 2022-07-15 MED ORDER — LACTATED RINGERS IV BOLUS (SEPSIS)
1000.0000 mL | Freq: Once | INTRAVENOUS | Status: AC
  Administered 2022-07-15: 1000 mL via INTRAVENOUS

## 2022-07-15 MED ORDER — VANCOMYCIN HCL IN DEXTROSE 1-5 GM/200ML-% IV SOLN
1000.0000 mg | Freq: Once | INTRAVENOUS | Status: AC
  Administered 2022-07-15: 1000 mg via INTRAVENOUS
  Filled 2022-07-15: qty 200

## 2022-07-15 MED ORDER — SODIUM CHLORIDE 0.9 % IV SOLN
500.0000 mg | INTRAVENOUS | Status: DC
  Administered 2022-07-15: 500 mg via INTRAVENOUS
  Filled 2022-07-15: qty 5

## 2022-07-15 MED ORDER — ACETAMINOPHEN 325 MG PO TABS
650.0000 mg | ORAL_TABLET | Freq: Once | ORAL | Status: AC
  Administered 2022-07-15: 650 mg via ORAL
  Filled 2022-07-15: qty 2

## 2022-07-15 MED ORDER — METRONIDAZOLE 500 MG/100ML IV SOLN
500.0000 mg | Freq: Once | INTRAVENOUS | Status: AC
  Administered 2022-07-15: 500 mg via INTRAVENOUS
  Filled 2022-07-15: qty 100

## 2022-07-15 MED ORDER — INSULIN ASPART 100 UNIT/ML IJ SOLN
0.0000 [IU] | Freq: Three times a day (TID) | INTRAMUSCULAR | Status: DC
  Administered 2022-07-16: 5 [IU] via SUBCUTANEOUS
  Administered 2022-07-16: 3 [IU] via SUBCUTANEOUS
  Administered 2022-07-17: 8 [IU] via SUBCUTANEOUS

## 2022-07-15 MED ORDER — FAMOTIDINE 20 MG PO TABS
20.0000 mg | ORAL_TABLET | Freq: Every day | ORAL | Status: DC
  Administered 2022-07-16 – 2022-07-17 (×2): 20 mg via ORAL
  Filled 2022-07-15 (×2): qty 1

## 2022-07-15 MED ORDER — SODIUM CHLORIDE 0.9 % IV SOLN
2.0000 g | INTRAVENOUS | Status: DC
  Administered 2022-07-16: 2 g via INTRAVENOUS
  Filled 2022-07-15: qty 20

## 2022-07-15 NOTE — Progress Notes (Addendum)
Pharmacy Antibiotic Note  Ryan Case is a 52 y.o. male for which pharmacy has been consulted for cefepime and vancomycin dosing for sepsis.  Patient with a history of mental delay, T2DM, HF. Patient presenting with AMS.  SCr 2 - UNK baseline WBC 11; LA 3.8; T 100.1>97.5; HR 101>75; RR 18 COVID neg Flu B - pos  Plan: Tamiflu per MD (renal dose adjusted) Metronidazole per MD Cefepime 2g q12hr Vancomycin 1000 mg once then 750 mg q24hr (eAUC 480.2) unless change in renal function --Given Scr of 2, renal function may be variable and may require vanco dosing per level if that is the case  Trend WBC, Fever, Renal function F/u cultures, clinical course, WBC, fever De-escalate when able Levels at steady state    Temp (24hrs), Avg:99.7 F (37.6 C), Min:99.2 F (37.3 C), Max:100.1 F (37.8 C)  No results for input(s): "WBC", "CREATININE", "LATICACIDVEN", "VANCOTROUGH", "VANCOPEAK", "VANCORANDOM", "GENTTROUGH", "GENTPEAK", "GENTRANDOM", "TOBRATROUGH", "TOBRAPEAK", "TOBRARND", "AMIKACINPEAK", "AMIKACINTROU", "AMIKACIN" in the last 168 hours.  CrCl cannot be calculated (No successful lab value found.).    No Known Allergies  Antimicrobials this admission: flagyl 1/6 >>  vancomycin 1/6 >>  cefepime 1/6 >>  Tamiflu 1/6 >> (1/11)  Microbiology results: Pending  Thank you for allowing pharmacy to be a part of this patient's care.  Lorelei Pont, PharmD, BCPS 07/15/2022 4:45 PM ED Clinical Pharmacist -  [phone]

## 2022-07-15 NOTE — ED Provider Notes (Addendum)
Patient presents for evaluation of change in mental status, patient stopped talking and will not complete activity per caregiver, last known well last night before bed.  Had fever this morning of 102, given Tylenol.  Denies history of seizure, stroke.    Patient is alert, not responding to with voice, not currently following directions therefore he has been sent to the nearest emergency department for further evaluation as unknown cause.  To be escorted by caregiver.  Vital signs are stable, point-of-care CBG 99.      Hans Eden, NP 07/16/22 0814    Hans Eden, NP 07/16/22 [phone]

## 2022-07-15 NOTE — Sepsis Progress Note (Signed)
Sepsis protocol is being followed by [facility]. 

## 2022-07-15 NOTE — Progress Notes (Signed)
D/w Dr. Johnney Ou, we will use ceftriaxone/azith to empirically cover for possible secondary bacterial PNA instead of vanc/cefepime.  Onnie Boer, PharmD, BCIDP, AAHIVP, CPP Infectious Disease Pharmacist 07/15/2022 9:57 PM

## 2022-07-15 NOTE — ED Notes (Signed)
Patient transported to CT 

## 2022-07-15 NOTE — ED Provider Notes (Signed)
[facility] EMERGENCY DEPARTMENT Provider Note   CSN: [id number] Arrival date & time: 07/15/22  1529     History  Chief Complaint  Patient presents with   Altered Mental Status    Ryan Case is a 51 y.o. male.  HPI 52 year old male with a history of mental retardation and behavioral issues who lives in a group home presents with altered mental status and fever.  Symptoms started this morning.  He had a little bit of sneezing last night but when he woke up this morning he did not immediately take his meds and seemed weaker than normal and less energetic.  Maybe a little bit of a cough.  He had a temperature of 102 and around 9 AM was given Tylenol with his other meds.  He states he has a little bit of a headache when asked.  Otherwise, the patient's hands have seemed a little discolored and cool and the patient seems to have a little bit of a tremor in his jaw.  Patient has a history of diabetes and CHF listed in his chart but the group home staff states that those have resolved when he lost a lot of weight.  Staff also notes that the patient recently finished an antibiotic course for a presumed dental infection.  He had some facial swelling on the right side that has completely resolved since he finished the course of antibiotics.  Home Medications Prior to Admission medications   Medication Sig Start Date End Date Taking? Authorizing Provider  ACCU-CHEK AVIVA PLUS test strip check blood sugar TWICE DAILY 10/21/21   [provider]  albuterol (VENTOLIN HFA) 108 (90 Base) MCG/ACT inhaler Inhale 2 puffs into the lungs every 4 (four) hours as needed. 03/08/20   [provider]  amitriptyline (ELAVIL) 25 MG tablet Take 25 mg by mouth at bedtime.      [provider]  benztropine (COGENTIN) 1 MG tablet Take 1 mg by mouth daily.      [provider]  Betamethasone Valerate (LUXIQ EX) Apply 17 % topically as needed.      [provider]  carvedilol (COREG) 3.125 MG tablet Take 3.125 mg by mouth 2 (two) times daily with a meal.      [provider]  desmopressin (DDAVP) 0.2 MG tablet TAKE TWO TABLETS EVERY DAY 03/24/21   McKenzie, Mardene Celeste, MD  digoxin (LANOXIN) 0.25 MG tablet Take 250 mcg by mouth daily.      [provider]  famotidine (PEPCID) 20 MG tablet Take 20 mg by mouth daily.      [provider]  fenofibrate (TRICOR) 145 MG tablet Take 48 mg by mouth daily.     [provider]  furosemide (LASIX) 40 MG tablet Take 40 mg by mouth daily.      [provider]  gabapentin (NEURONTIN) 400 MG capsule Take 400 mg by mouth 3 (three) times daily. Takes all three capsules at bedtime.    [provider]  glipiZIDE (GLIPIZIDE XL) 10 MG 24 hr tablet Take 20 mg by mouth daily.      [provider]  glipiZIDE-metformin (METAGLIP) 5-500 MG tablet Take 1 tablet by mouth daily. 07/31/19   [provider]  Loratadine 10 MG CAPS Take by mouth daily. Taking PRN.    [provider]  LORazepam (ATIVAN) 1 MG tablet Take 1 mg by mouth daily.     [provider]  LORAZEPAM PO Take 0.5 mg  by mouth daily.     [provider]  Metoclopramide HCl 10 MG TBDP Take 1 tablet by mouth 2 (two) times daily.      [provider]  neomycin-polymyxin-pramoxine (NEOSPORIN PLUS) 1 % cream Apply to left foot lacerations once daily until healed. 03/31/20   Galaway, Lise Auer, DPM  NOREL AD 4-10-325 MG TABS Take 1 tablet by mouth 4 (four) times daily. 03/23/21   [provider]  OMEPRAZOLE PO Take 20 mg by mouth daily.      [provider]  potassium chloride (KLOR-CON) 10 MEQ tablet Take 20 mEq by mouth daily. 03/08/20   [provider]  POTASSIUM CHLORIDE CR PO Take 10 mg by mouth 2 (two) times daily.      [provider]  Promethazine HCl 6.25 MG/5ML SOLN SMARTSIG:1 Teaspoon By Mouth Every 4-6 Hours PRN  03/23/21   [provider]  QC LO-DOSE ASPIRIN 81 MG EC tablet Take 81 mg by mouth daily. 07/31/19   [provider]  risperiDONE (RISPERDAL) 3 MG tablet Take 3 mg by mouth 2 (two) times daily.      [provider]  simvastatin (ZOCOR) 20 MG tablet Take 20 mg by mouth daily. 04/24/19   [provider]  tamsulosin (FLOMAX) 0.4 MG CAPS capsule Take 0.4 mg by mouth daily. 07/24/21   [provider]  theophylline (THEODUR) 200 MG 12 hr tablet Take 200 mg by mouth 2 (two) times daily.      [provider]  theophylline (UNIPHYL) 400 MG 24 hr tablet Take 400 mg by mouth daily. 09/15/19   [provider]  Transparent Dressings (NEXCARE TEGADERM 4"X4-3/4") MISC Apply topically as directed. 03/16/20   [provider]  traZODone (DESYREL) 50 MG tablet  07/13/18   [provider]      Allergies    Patient has no known allergies.    Review of Systems   Review of Systems  Constitutional:  Positive for fever.  Skin:  Positive for color change.    Physical Exam Updated Vital Signs BP 133/80   Pulse 82   Temp 100.1 F (37.8 C) (Oral)   Resp 18   SpO2 95%  Physical Exam Vitals and nursing note reviewed.  Constitutional:      Appearance: He is well-developed. He is ill-appearing. He is not diaphoretic.  HENT:     Head: Normocephalic and atraumatic.  Cardiovascular:     Rate and Rhythm: Normal rate and regular rhythm.     Pulses:          Radial pulses are 1+ on the right side and 1+ on the left side.       Dorsalis pedis pulses are 1+ on the right side and 1+ on the left side.     Heart sounds: Normal heart sounds.  Pulmonary:     Effort: Pulmonary effort is normal.     Breath sounds: Normal breath sounds.  Abdominal:     Palpations: Abdomen is soft.     Tenderness: There is no abdominal tenderness.  Musculoskeletal:     Cervical back: No rigidity.  Skin:    General: Skin is warm and dry.     Capillary Refill:  Capillary refill takes more than 3 seconds.     Comments: Both hands and both feet are cool to the touch. Delayed cap refill. The middle finger on the right hand appears paler than others.   Neurological:     Mental Status:  He is alert.     Comments: Patient is awake and alert. He is hard to understand but when he's speaking he is speaking quieter than normal but otherwise at his baseline Patient has a fine tremor noted to his jaw     ED Results / Procedures / Treatments   Labs (all labs ordered are listed, but only abnormal results are displayed) Labs Reviewed  RESP PANEL BY RT-PCR (RSV, FLU A&B, COVID)  RVPGX2 - Abnormal; Notable for the following components:      Result Value   Influenza B by PCR POSITIVE (*)    All other components within normal limits  LACTIC ACID, PLASMA - Abnormal; Notable for the following components:   Lactic Acid, Venous 2.5 (*)    All other components within normal limits  COMPREHENSIVE METABOLIC PANEL - Abnormal; Notable for the following components:   Glucose, Bld 118 (*)    BUN 28 (*)    Creatinine, Ser 1.86 (*)    Calcium 8.4 (*)    AST 45 (*)    GFR, Estimated 43 (*)    All other components within normal limits  CBC WITH DIFFERENTIAL/PLATELET - Abnormal; Notable for the following components:   WBC 11.0 (*)    Neutro Abs 8.1 (*)    Monocytes Absolute 2.2 (*)    All other components within normal limits  I-STAT CHEM 8, ED - Abnormal; Notable for the following components:   BUN 31 (*)    Creatinine, Ser 2.00 (*)    Glucose, Bld 117 (*)    Calcium, Ion 1.01 (*)    All other components within normal limits  CULTURE, BLOOD (ROUTINE X 2)  CULTURE, BLOOD (ROUTINE X 2)  URINE CULTURE  PROTIME-INR  APTT  LACTIC ACID, PLASMA  URINALYSIS, ROUTINE W REFLEX MICROSCOPIC  CBG MONITORING, ED    EKG EKG Interpretation  Date/Time:  Saturday July 15 2022 16:36:45 EST Ventricular Rate:  92 PR Interval:  143 QRS Duration: 92 QT Interval:  332 QTC  Calculation: 411 R Axis:   62 Text Interpretation: Sinus rhythm Probable anteroseptal infarct, recent Abnormal T, consider ischemia, diffuse leads Lateral leads are also involved Confirmed by Sherwood Gambler [phone]) on 07/15/2022 4:47:44 PM  Radiology CT Head Wo Contrast  Result Date: 07/15/2022 CLINICAL DATA:  Mental status change, unknown cause. EXAM: CT HEAD WITHOUT CONTRAST TECHNIQUE: Contiguous axial images were obtained from the base of the skull through the vertex without intravenous contrast. RADIATION DOSE REDUCTION: This exam was performed according to the departmental dose-optimization program which includes automated exposure control, adjustment of the mA and/or kV according to patient size and/or use of iterative reconstruction technique. COMPARISON:  None Available. FINDINGS: Brain: There is no evidence of an acute infarct, intracranial hemorrhage, mass, midline shift, or extra-axial fluid collection. The ventricles and sulci are within normal limits for age. A small cavum septum pellucidum et vergae is noted. There is somewhat prominent bilateral globus pallidus calcification as well as asymmetrically prominent calcification in the left dentate nucleus. Vascular: No hyperdense vessel. Skull: No fracture or suspicious osseous lesion. Sinuses/Orbits: Trace mucosal thickening or fluid in the included portion of the right maxillary sinus. Underdeveloped mastoid air cells. Unremarkable orbits. Other: None. IMPRESSION: No evidence of acute intracranial abnormality. Electronically Signed   By: Logan Bores M.D.   On: 07/15/2022 17:29   DG Chest Port 1 View  Result Date: 07/15/2022 CLINICAL DATA:  Generalized fatigue. EXAM: PORTABLE CHEST 1 VIEW COMPARISON:  None Available. FINDINGS: The heart  size and mediastinal contours are within normal limits. Low lung volumes are noted. Mildly increased opacification is noted along the infrahilar region on the right. There is no evidence of a pleural effusion or  pneumothorax. The visualized skeletal structures are unremarkable. IMPRESSION: Findings likely consistent with mild right infrahilar atelectasis versus early infiltrate. Follow-up to resolution is recommended. Electronically Signed   By: Aram Candela M.D.   On: 07/15/2022 17:05    Procedures .Critical Care  Performed by: Pricilla Loveless, MD Authorized by: Pricilla Loveless, MD   Critical care provider statement:    Critical care time (minutes):  35   Critical care time was exclusive of:  Separately billable procedures and treating other patients   Critical care was necessary to treat or prevent imminent or life-threatening deterioration of the following conditions:  Renal failure and sepsis   Critical care was time spent personally by me on the following activities:  Development of treatment plan with patient or surrogate, discussions with consultants, evaluation of patient's response to treatment, examination of patient, ordering and review of laboratory studies, ordering and review of radiographic studies, ordering and performing treatments and interventions, pulse oximetry, re-evaluation of patient's condition and review of old charts     Medications Ordered in ED Medications  lactated ringers bolus 1,000 mL (1,000 mLs Intravenous New Bag/Given 07/15/22 1652)  metroNIDAZOLE (FLAGYL) IVPB 500 mg (500 mg Intravenous New Bag/Given 07/15/22 1713)  vancomycin (VANCOCIN) IVPB 1000 mg/200 mL premix (has no administration in time range)  oseltamivir (TAMIFLU) capsule 30 mg (has no administration in time range)  lactated ringers bolus 1,000 mL (has no administration in time range)  acetaminophen (TYLENOL) tablet 650 mg (650 mg Oral Given 07/15/22 1649)  ceFEPIme (MAXIPIME) 2 g in sodium chloride 0.9 % 100 mL IVPB (0 g Intravenous Stopped 07/15/22 1712)    ED Course/ Medical Decision Making/ A&P                           Medical Decision Making Amount and/or Complexity of Data Reviewed Labs:  ordered.    Details: Kidney disease of unclear onset, will presume acute kidney injury. Mild leukocytosis. Lactic acidosis consistent with dehydration/sepsis Influenza B test positive.  Radiology: independent interpretation performed.    Details: Right middle lobe developing infiltrate CT head without head bleed ECG/medicine tests: independent interpretation performed.    Details: Nonspecific T waves.  Risk OTC drugs. Prescription drug management. Decision regarding hospitalization.   Patient ultimately is found to have influenza B.  I suspect this is probably causing most of his symptoms and his change in mental status/energy level is from the fever.  However he does have cool extremities with delayed cap refill and so he was given IV fluids.  He is not hypotensive.  His lactate suggest sepsis/dehydration.  He was started on broad antibiotics before the influenza test came back.  This could be bacterial pneumonia and influenza but probably is all influenza.  Thus I think you will need supportive care and admission.  I have consulted the internal medicine teaching service.        Final Clinical Impression(s) / ED Diagnoses Final diagnoses:  Influenza B  Sepsis with acute renal failure without septic shock, due to unspecified organism, unspecified acute renal failure type [facility])    Rx / DC Orders ED Discharge Orders     None         Pricilla Loveless, MD 07/15/22 [phone]

## 2022-07-15 NOTE — H&P (Addendum)
Date: 07/15/2022               Patient Name:  Ryan Case MRN: [id number]  DOB: 06-12-1971 Age / Sex: 52 y.o., male   PCP: [name], MD         Medical Service: Internal Medicine Teaching Service         Attending Physician: Dr. Reymundo Poll, MD      First Contact: Dr. Olegario Messier, MD Pager [phone]    Second Contact: Dr. Rudene Christians, DO Pager [phone]         After Hours (After 5p/  First Contact Pager: [phone]  weekends / holidays): Second Contact Pager: [phone]   SUBJECTIVE   Chief Complaint: AMS and fever  History of Present Illness:   [name] is a 52 year old male with a past medical history of mental delay, T2DM, and HFpEF, who lives in a group home and presents with altered mental status and decreased functional state for the last day. History is obtained from group home leader. At baseline, pt is able to walk, talk, eat by himself, get out of bed, and would respond to prompts and commands efficiently. Yesterday, home leader noticed that he was not eating and drinking like normal. Also noticed that he was congested. This morning, he noticed pt was much less active and was very weak. He had to carry pt out of bed and hold him up in the shower. He also endorsed pt eating less, which is a stark difference from his baseline. His temperature was checked at the time and was 102. Also noticed cold fingers. Pt was given two doses of tylenol last night and this morning, which did break the fever however fever returned very shortly after that. Of note, one of staff members was sick with influenza last week. Group home leader denies pt complaining of any chest pain, dyspnea, and has not noticed any diarrhea or change in bowel habits.    ED Course: Chest X-Ray obtained showed no obvious signs of pulmonary disease. CMP revealed elevated creatinine at 2.00, and elevated AST at 45. LA elevated at 2.5, with WBC at 11.0. CT Head also showed no acute intracranial  abnormality. Pt tested positive for Influenza B, and Code Sepsis was called. He was started on cefepime, metronidazole and vancomycin for CAP coverage, and tamiflu. He was also given two LR bolus's in the ED.   Meds:  Medicine confirmation from Group Home Leader, did not have documentation with him and does not know most of the doses.  Albuterol Amitriptyline Cogentin Coreg 3.125 Desmopression 0.2 BID Digoxin Pepcid Furosemide 40 Gabapentin 3 cap qhs Glipizide-metformin Lorazepam qhs Potassium 20 Promethazine Aspirin 81mg  Risperdal BID Tamsulosin Theophylline Trazodone 50 mg  Past Medical History HFpEF T2DM Developmental Delay   Social:  Lives With: Group Home Occupation: Unemployed Support: Group Home Staff Level of Function: Able to perform ADLs when prompted PCP: Dr. Substances: No substance use history  Family History:   No known family history  Allergies: No Known Drug Allergies  Review of Systems: A complete ROS was negative except as per HPI.   OBJECTIVE:   Physical Exam: Blood pressure 133/80, pulse 82, temperature 100.1 F (37.8 C), temperature source Oral, resp. rate 18, SpO2 95 %.  Constitutional: Alert gentleman, developmental delay, slow movements, minimal communication, able to follow commands with prompting, in no acute distress HENT: normocephalic atraumatic, mucous membranes moist Eyes: conjunctiva non-erythematous Neck: supple Cardiovascular: regular rate and rhythm, no  m/r/g Pulmonary/Chest: normal work of breathing on room air, lungs clear to auscultation bilaterally Abdominal: soft, non-tender, non-distended MSK: Atrophy noted in both hands bilaterally Neurological: alert & oriented x 3, 5/5 strength in bilateral upper and lower extremities, normal gait Skin: Upper and lower extremities cold to touch bilaterally. Psych: Calm, developmental delay  Labs: CBC    Component Value Date/Time   WBC 11.0 (H) 07/15/2022 1617    RBC 5.18 07/15/2022 1617   HGB 15.0 07/15/2022 1709   HCT 44.0 07/15/2022 1709   PLT 171 07/15/2022 1617   MCV 85.9 07/15/2022 1617   MCH 29.5 07/15/2022 1617   MCHC 34.4 07/15/2022 1617   RDW 12.3 07/15/2022 1617   LYMPHSABS 0.7 07/15/2022 1617   MONOABS 2.2 (H) 07/15/2022 1617   EOSABS 0.0 07/15/2022 1617   BASOSABS 0.0 07/15/2022 1617     CMP     Component Value Date/Time   NA 142 07/15/2022 1709   K 4.4 07/15/2022 1709   CL 104 07/15/2022 1709   CO2 25 07/15/2022 1617   GLUCOSE 117 (H) 07/15/2022 1709   BUN 31 (H) 07/15/2022 1709   CREATININE 2.00 (H) 07/15/2022 1709   CALCIUM 8.4 (L) 07/15/2022 1617   PROT 7.2 07/15/2022 1617   ALBUMIN 3.6 07/15/2022 1617   AST 45 (H) 07/15/2022 1617   ALT 26 07/15/2022 1617   ALKPHOS 74 07/15/2022 1617   BILITOT 0.6 07/15/2022 1617   GFRNONAA 43 (L) 07/15/2022 1617    Imaging:  CT Head Wo Contrast  Result Date: 07/15/2022 CLINICAL DATA:  Mental status change, unknown cause. EXAM: CT HEAD WITHOUT CONTRAST TECHNIQUE: Contiguous axial images were obtained from the base of the skull through the vertex without intravenous contrast. RADIATION DOSE REDUCTION: This exam was performed according to the departmental dose-optimization program which includes automated exposure control, adjustment of the mA and/or kV according to patient size and/or use of iterative reconstruction technique. COMPARISON:  None Available. FINDINGS: Brain: There is no evidence of an acute infarct, intracranial hemorrhage, mass, midline shift, or extra-axial fluid collection. The ventricles and sulci are within normal limits for age. A small cavum septum pellucidum et vergae is noted. There is somewhat prominent bilateral globus pallidus calcification as well as asymmetrically prominent calcification in the left dentate nucleus. Vascular: No hyperdense vessel. Skull: No fracture or suspicious osseous lesion. Sinuses/Orbits: Trace mucosal thickening or fluid in the  included portion of the right maxillary sinus. Underdeveloped mastoid air cells. Unremarkable orbits. Other: None. IMPRESSION: No evidence of acute intracranial abnormality. Electronically Signed   By: Sebastian Ache M.D.   On: 07/15/2022 17:29   DG Chest Port 1 View  Result Date: 07/15/2022 CLINICAL DATA:  Generalized fatigue. EXAM: PORTABLE CHEST 1 VIEW COMPARISON:  None Available. FINDINGS: The heart size and mediastinal contours are within normal limits. Low lung volumes are noted. Mildly increased opacification is noted along the infrahilar region on the right. There is no evidence of a pleural effusion or pneumothorax. The visualized skeletal structures are unremarkable. IMPRESSION: Findings likely consistent with mild right infrahilar atelectasis versus early infiltrate. Follow-up to resolution is recommended. Electronically Signed   By: Aram Candela M.D.   On: 07/15/2022 17:05     EKG: personally reviewed my interpretation is sinus rhythm, no prolonged intervals or axis deviation, diffuse T wave inversions, no prior EKG to compare to.  ASSESSMENT & PLAN:   Assessment & Plan by Problem:   DREDYN GUBBELS is a 52 y.o. person living with a history  of mental delay, T2DM, and HFpEF who presented with decreased functional status and admitted for sepsis workup secondary to influenza on hospital day 0  #Influenza B Infection  Pt presents with decreased functional status for the last day with fever and weakness. Pt tachycardic, leukocytosis with neutrophil predominance at 11.0, evidence of end organ damage with elevated creatinine and AST, and elevated lactic acid at 2.5. Extremities cold as well. Pt was found to be positive for influenza B. Pt started on tamiflu, and cefdenir and vancomycin in the emergency department and has already received the first doses of all of them. Low suspicion for bacterial superinfection at this time given x-ray findings and pt being on room air. Will hold antibiotics  for now, until cultures return and more evidence is seen of bacterial infection.  He has also received 2LR Bolus's in the emergency department. Will need input from Group Leader when pt is back to baseline. Seems as if he has great functional status at home, and able to complete and have full conversations. Will need to assess on a day to day basis.   Plan:  - Monitor vitals  - F/U Pro calcitonin - F/U Repeat LA - F/U UA, urine cultures, blood cultures - Tamiflu 30mg  (Day 1) - Cefdenir and Vancomycin (Day 1). Can consider changing based off procalcitonin - PT/OT  #AKI vs CKD Pt currently has creatinine of 2.00, however per group home leader no sign of kidney disease, no previous values to compare to either. Likely secondary to infection as well. Will need to monitor BMP daily for resolution with fluid. BUN/Cr ratio <20, concerning for retention. Will obtain bladder scan.   Plan:  - Daily BMP  - Consider renal ultrasound if creatinine does not improve - Avoid nephrotoxic management.  - Strict I/O - Holding home lasix - F/U Bladder Scan  #Diffuse T Wave Inversions Seen on EKG, no previous EKG to compare to. Denies any chest pain, shortness of breath, nausea, or vomiting. Will order troponin and trend if elevated.   Plan:  - F/U Troponins  #Acute Metabolic Encephalopathy 2/2 Infection  Pt seems altered from baseline per Group Home Leader, likely in the setting of infection. Will need to monitor functional status day by day. CT Head ruled out structural abnormalities, no trauma noted recently either.   #HFpEF Pt at home takes Lasix, Carvedilol, Digoxin, assuming ly for history of heart failure. Unable to obtain specific records or echo results confirmed diagnosis, however per Group Home Director, he takes these medications everyday. Could be he had HFrEF, which then improved to HFpEF. Pt seems euvolemic on exam. Will hold Lasix and Carvedilol at the moment, and continue  Digoxin.  Plan: - Continue Digoxin    #Type 2 Diabetes Mellitus Will start sliding scale of insulin, follow up A1c.    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 Previously was taking congentin, however was discontinued. Currently takes risperidone for management. Pt has very mild and subtle right jaw tremors, could be due to pt being switched off congentin.   Plan:  - Continue risperidone    Diet: Normal VTE: Xarelto IVF: None,None Code: Full  Prior to Admission Living Arrangement: Group Home Anticipated Discharge Location: Group Home Barriers to Discharge: Medical Management  Dispo: Admit patient to Observation with expected length of stay less than 2 midnights.  Signed: , MD Internal Medicine Resident PGY-1  07/15/2022, 7:12 PM   Dr.  Drucie Opitz, MD Pager [phone]

## 2022-07-15 NOTE — ED Provider Triage Note (Signed)
Emergency Medicine Provider Triage Evaluation Note  Ryan Case , a 52 y.o. male  was evaluated in triage.  Pt accompanied by family members.  Patient's grandson provides most of the history.  States patient has significant change in mental status since last night.  While walking around, began "looked ill".    Grandson went to get him this morning, normally patient meets the family downstairs and is already making his own breakfast.  However had to get the patient help him out of bed.  Patient has not been talking, had a fever of 102, was given Tylenol with some temporary improvement however fever returned.  Patient also urinated on himself, was given a bath prior to arrival.  Also notes discoloration of multiple areas of the hands, stating they look pale or blue.  Patient has not endorsed any pain.  Grandson denies nausea, vomiting, diarrhea, or perceived shortness of breath.  Falls often, not on anticoagulation.  On this provider's exam, patient has not provided any answers and does not respond to questioning, though eyes open and looks around the room.  Sitting in chair, leaning to the side.  Review of Systems  Positive:  Negative: See above  Physical Exam  BP (!) 103/41 (BP Location: Left Arm)   Pulse (!) 101   Temp 100.1 F (37.8 C) (Oral)   Resp 16   SpO2 98%  Gen:   Awake, ill-appearing Resp:  Normal effort, CTAB, follows commands of taking deep inspiration while auscultating MSK:   Sitting in wheelchair, able to mildly wiggle fingers and supinate pronate of commands. Other:  Weak appearing, appears clinically dehydrated.  GCS 14-15, slow to respond.  No obviuos evidence of head trauma.  Abdomen soft, nontender, nondistended.  No cardiac murmur.  Medical Decision Making  Medically screening exam initiated at 4:11 PM.  Appropriate orders placed.  Kathrynn Running was informed that the remainder of the evaluation will be completed by another provider, this initial triage assessment  does not replace that evaluation, and the importance of remaining in the ED until their evaluation is complete.  Noted BP soft, initial 103/41, subsequent 108/50s.  Tachycardic 101.  Temp 100.1 after tylenol.  Likely sepsis.  Called and spoke with charge nurse, pt needs to come back to next available room, charge nurse acknowledged.   Prince Rome, PA-C 41/28/78 [id number]

## 2022-07-15 NOTE — Hospital Course (Signed)
Was not as active yesterday, eating and drinking like normal yesterday. Congested starting 2 days ago Staff sick last week Peeing less today No diarrhea Fingers were cold Eyes looked glassy  Fever 102 this morning Having trouble walking, had o hold in shower No chest pain, SOB, no pain He was shaking and felt hot Not eating and drinking Tylenol last night and this morning   Social He can do adls on own with telling him to Mom and sister live in [facility] has taken care of him for 10 years  PMH DM HFpEF lost a lot of weight and not as bad Mild intellectional disability Intermittent explosive disorder  Meds: Albuterol Amitri Cogentin??? Coreg 3125 Desmopression 0.2 bud Digoxin Pepcid Furosemide 40 Gaba 3 cap qhs Glipiide-metfor Lorazepam qhs Potassium 20 Promethazine Asa 81 Risperdal BID Tamsulosin Theophylline Trazodone 50 mg   1/8  Slept well Eating well Did not adjust to the nurse well this AM Congested when coughing but no breathing trouble No pain,but doesn't mention this normally Yesterday ambulating well, had to be coached much more today At bedside with family member Happy about balloons that larry gave him

## 2022-07-15 NOTE — ED Notes (Signed)
Patient has not urinated yet to obtain post void residual.  DO made aware.

## 2022-07-15 NOTE — ED Triage Notes (Addendum)
Pt bib family with reports of generalized fatigue, decreased PO intake and somnolence onset today. Febrile up to 102 at home, given tylenol this morning. Pt slow to respond. Pt fingertips noted to be dusky.

## 2022-07-15 NOTE — Progress Notes (Signed)
CBG is 68. Juice given and will recheck in a few minutes.   Ryan Case [name]

## 2022-07-15 NOTE — ED Notes (Signed)
Person that can with pt stated that he right hand is dis colored and cold to the touch and wanted to make sure that someone knew about it. I will notify nurse and pa in triage.

## 2022-07-16 ENCOUNTER — Observation Stay (HOSPITAL_COMMUNITY): Payer: Medicare Other

## 2022-07-16 DIAGNOSIS — Z87891 Personal history of nicotine dependence: Secondary | ICD-10-CM

## 2022-07-16 DIAGNOSIS — J1089 Influenza due to other identified influenza virus with other manifestations: Secondary | ICD-10-CM | POA: Diagnosis not present

## 2022-07-16 DIAGNOSIS — N179 Acute kidney failure, unspecified: Secondary | ICD-10-CM | POA: Diagnosis present

## 2022-07-16 DIAGNOSIS — J101 Influenza due to other identified influenza virus with other respiratory manifestations: Secondary | ICD-10-CM | POA: Diagnosis present

## 2022-07-16 LAB — COMPREHENSIVE METABOLIC PANEL
ALT: 22 U/L (ref 0–44)
AST: 35 U/L (ref 15–41)
Albumin: 2.7 g/dL — ABNORMAL LOW (ref 3.5–5.0)
Alkaline Phosphatase: 57 U/L (ref 38–126)
Anion gap: 9 (ref 5–15)
BUN: 25 mg/dL — ABNORMAL HIGH (ref 6–20)
CO2: 27 mmol/L (ref 22–32)
Calcium: 7.9 mg/dL — ABNORMAL LOW (ref 8.9–10.3)
Chloride: 104 mmol/L (ref 98–111)
Creatinine, Ser: 1.34 mg/dL — ABNORMAL HIGH (ref 0.61–1.24)
GFR, Estimated: >60 mL/min (ref >60)
Glucose, Bld: 98 mg/dL (ref 70–99)
Potassium: 4.2 mmol/L (ref 3.5–5.1)
Sodium: 140 mmol/L (ref 135–145)
Total Bilirubin: 0.5 mg/dL (ref 0.3–1.2)
Total Protein: 5.8 g/dL — ABNORMAL LOW (ref 6.5–8.1)

## 2022-07-16 LAB — GLUCOSE, CAPILLARY
Glucose-Capillary: 101 mg/dL — ABNORMAL HIGH (ref 70–99)
Glucose-Capillary: 231 mg/dL — ABNORMAL HIGH (ref 70–99)
Glucose-Capillary: 208 mg/dL — ABNORMAL HIGH (ref 70–99)
Glucose-Capillary: 233 mg/dL — ABNORMAL HIGH (ref 70–99)

## 2022-07-16 LAB — CBC
HCT: 36.2 % — ABNORMAL LOW (ref 39.0–52.0)
Hemoglobin: 12.7 g/dL — ABNORMAL LOW (ref 13.0–17.0)
MCH: 29.5 pg (ref 26.0–34.0)
MCHC: 35.1 g/dL (ref 30.0–36.0)
MCV: 84.2 fL (ref 80.0–100.0)
Platelets: 130 10*3/uL — ABNORMAL LOW (ref 150–400)
RBC: 4.3 MIL/uL (ref 4.22–5.81)
RDW: 12.5 % (ref 11.5–15.5)
WBC: 4.6 10*3/uL (ref 4.0–10.5)
nRBC: 0 % (ref 0.0–0.2)

## 2022-07-16 LAB — URINE CULTURE: Culture: NO GROWTH

## 2022-07-16 LAB — DIGOXIN LEVEL: Digoxin Level: 1 ng/mL (ref 0.8–2.0)

## 2022-07-16 LAB — LACTIC ACID, PLASMA: Lactic Acid, Venous: 1.7 mmol/L (ref 0.5–1.9)

## 2022-07-16 MED ORDER — CARVEDILOL 3.125 MG PO TABS
3.1250 mg | ORAL_TABLET | Freq: Two times a day (BID) | ORAL | Status: DC
  Administered 2022-07-17: 3.125 mg via ORAL
  Filled 2022-07-16: qty 1

## 2022-07-16 MED ORDER — PROMETHAZINE HCL 6.25 MG/5ML PO SOLN: Freq: Four times a day (QID) | ORAL | Status: DC | PRN

## 2022-07-16 MED ORDER — GLIPIZIDE 5 MG PO TABS
5.0000 mg | ORAL_TABLET | Freq: Every day | ORAL | Status: DC
  Administered 2022-07-17: 5 mg via ORAL
  Filled 2022-07-16: qty 1

## 2022-07-16 MED ORDER — THEOPHYLLINE ER 400 MG PO TB24
400.0000 mg | ORAL_TABLET | Freq: Every day | ORAL | Status: DC
  Administered 2022-07-17: 400 mg via ORAL
  Filled 2022-07-16: qty 1

## 2022-07-16 MED ORDER — LORAZEPAM 1 MG PO TABS
1.0000 mg | ORAL_TABLET | Freq: Every day | ORAL | Status: DC
  Administered 2022-07-16: 1 mg via ORAL
  Filled 2022-07-16: qty 1

## 2022-07-16 MED ORDER — DESMOPRESSIN ACETATE 0.1 MG PO TABS
0.4000 mg | ORAL_TABLET | Freq: Every day | ORAL | Status: DC
  Administered 2022-07-16: 0.4 mg via ORAL
  Filled 2022-07-16 (×2): qty 4

## 2022-07-16 MED ORDER — AMITRIPTYLINE HCL 25 MG PO TABS
25.0000 mg | ORAL_TABLET | Freq: Every day | ORAL | Status: DC
  Administered 2022-07-16: 25 mg via ORAL
  Filled 2022-07-16: qty 1

## 2022-07-16 MED ORDER — TAMSULOSIN HCL 0.4 MG PO CAPS
0.4000 mg | ORAL_CAPSULE | Freq: Every day | ORAL | Status: DC
  Administered 2022-07-16: 0.4 mg via ORAL
  Filled 2022-07-16: qty 1

## 2022-07-16 MED ORDER — METFORMIN HCL 500 MG PO TABS
500.0000 mg | ORAL_TABLET | Freq: Every day | ORAL | Status: DC
  Administered 2022-07-17: 500 mg via ORAL
  Filled 2022-07-16: qty 1

## 2022-07-16 MED ORDER — TRAZODONE HCL 50 MG PO TABS
50.0000 mg | ORAL_TABLET | Freq: Every day | ORAL | Status: DC
  Administered 2022-07-16: 50 mg via ORAL
  Filled 2022-07-16: qty 1

## 2022-07-16 MED ORDER — ORAL CARE MOUTH RINSE: 15.0000 mL | OROMUCOSAL | Status: DC | PRN

## 2022-07-16 MED ORDER — ALBUTEROL SULFATE (2.5 MG/3ML) 0.083% IN NEBU: 3.0000 mL | INHALATION_SOLUTION | RESPIRATORY_TRACT | Status: DC | PRN

## 2022-07-16 MED ORDER — GABAPENTIN 400 MG PO CAPS
1200.0000 mg | ORAL_CAPSULE | Freq: Every day | ORAL | Status: DC
  Administered 2022-07-16: 1200 mg via ORAL
  Filled 2022-07-16: qty 3

## 2022-07-16 MED ORDER — AMANTADINE HCL 100 MG PO CAPS
100.0000 mg | ORAL_CAPSULE | Freq: Every morning | ORAL | Status: DC
  Administered 2022-07-17: 100 mg via ORAL
  Filled 2022-07-16: qty 1

## 2022-07-16 MED ORDER — GLIPIZIDE-METFORMIN HCL 5-500 MG PO TABS: 1.0000 | ORAL_TABLET | Freq: Every day | ORAL | Status: DC

## 2022-07-16 MED ORDER — POTASSIUM CHLORIDE CRYS ER 10 MEQ PO TBCR
20.0000 meq | EXTENDED_RELEASE_TABLET | Freq: Every day | ORAL | Status: DC
  Administered 2022-07-16 – 2022-07-17 (×2): 20 meq via ORAL
  Filled 2022-07-16 (×2): qty 2

## 2022-07-16 MED ORDER — ASPIRIN 81 MG PO TBEC
81.0000 mg | DELAYED_RELEASE_TABLET | Freq: Every day | ORAL | Status: DC
  Administered 2022-07-16 – 2022-07-17 (×2): 81 mg via ORAL
  Filled 2022-07-16 (×2): qty 1

## 2022-07-16 NOTE — Discharge Summary (Addendum)
Name: Ryan Case MRN: [id number] DOB: 26-Aug-1970 52 y.o. PCP: Lin Landsman, MD  Date of Admission: 07/15/2022  3:49 PM Date of Discharge: 07/17/2022 Attending Physician: Charise Killian, MD  Discharge Diagnosis: 1. Principal Problem:   Influenza B Active Problems:   Diabetes mellitus ([place])   Sleep apnea   Acute kidney injury [facility])    Discharge Medications: Allergies as of 07/17/2022   No Known Allergies      Medication List     STOP taking these medications    furosemide 40 MG tablet Commonly known as: LASIX       TAKE these medications    Accu-Chek Aviva Plus test strip Generic drug: glucose blood check blood sugar TWICE DAILY   albuterol 108 (90 Base) MCG/ACT inhaler Commonly known as: VENTOLIN HFA Inhale 2 puffs into the lungs every 4 (four) hours as needed.   amantadine 100 MG capsule Commonly known as: SYMMETREL Take 100 mg by mouth every morning.   amitriptyline 25 MG tablet Commonly known as: ELAVIL Take 25 mg by mouth at bedtime.   carvedilol 3.125 MG tablet Commonly known as: COREG Take 3.125 mg by mouth 2 (two) times daily with a meal.   desmopressin 0.2 MG tablet Commonly known as: DDAVP TAKE TWO TABLETS EVERY DAY What changed: See the new instructions.   famotidine 20 MG tablet Commonly known as: PEPCID Take 20 mg by mouth at bedtime.   gabapentin 400 MG capsule Commonly known as: NEURONTIN Take 1,200 mg by mouth at bedtime. Takes all three capsules at bedtime.   glipiZIDE-metformin 5-500 MG tablet Commonly known as: METAGLIP Take 1 tablet by mouth daily.   Lanoxin 0.25 MG tablet Generic drug: digoxin Take 250 mcg by mouth daily.   LORazepam 1 MG tablet Commonly known as: ATIVAN Take 1 mg by mouth at bedtime.   NEOSPORIN PLUS 1 % cream Generic drug: neomycin-polymyxin-pramoxine Apply to left foot lacerations once daily until healed.   Nexcare Tegaderm 4"x4-3/4" Misc Apply topically as directed.   oseltamivir 30 MG  capsule Commonly known as: TAMIFLU Take 1 capsule (30 mg total) by mouth 2 (two) times daily. Take 1 capsule tonight, then two twice daily for two more days.   potassium chloride 10 MEQ tablet Commonly known as: KLOR-CON Take 20 mEq by mouth daily.   Promethazine HCl 6.25 MG/5ML Soln SMARTSIG:1 Teaspoon By Mouth Every 4-6 Hours PRN   QC LO-DOSE ASPIRIN 81 MG tablet Generic drug: aspirin EC Take 81 mg by mouth daily.   RisperDAL 3 MG tablet Generic drug: risperiDONE Take 3 mg by mouth 2 (two) times daily.   Systane Complete PF 0.6 % Soln Generic drug: Propylene Glycol (PF) Place 1 drop into both eyes 6 (six) times daily.   tamsulosin 0.4 MG Caps capsule Commonly known as: FLOMAX Take 0.4 mg by mouth at bedtime.   theophylline 400 MG 24 hr tablet Commonly known as: UNIPHYL Take 400 mg by mouth daily.   traZODone 50 MG tablet Commonly known as: DESYREL Take 50 mg by mouth at bedtime.        Disposition and follow-up:   Mr.Ryan Case was discharged from [facility] in Good condition.  At the hospital follow up visit please address:  1.  Held home lasix, would consider restarting after pt has fully recovered from flu. Please ensure pt finishes tamiflu course. Pt also had AKI while in the hospital that was normalizing, please ensure adequate urine output.   2.  Labs /  imaging needed at time of follow-up: BMP/CBC  3.  Pending labs/ test needing follow-up: NA  Follow-up Appointments:   Hospital Course by problem list:  #Influenza B Infection  Patient presents with decreased functional status for the last day with fever and weakness.  In the emergency department, he was tachycardic and had leukocytosis with neutrophil predominance at 11.0, with evidence of organ damage with elevated creatinine and AST, elevated lactic acid at 2.5.  Initial concern was for sepsis, however this was most likely in the setting of his influenza infection.  X-ray findings  were relatively benign, so low suspicion was given for bacterial superinfection.  He received 2 LR boluses in the emergency department.  Pro-Cal was positive, however given no x-ray findings, unlikely bacterial pneumonia.  Patient was started on cefdinir, and vancomycin in the emergency department as well as Tamiflu.  Cefdinir and vancomycin were removed from regimen.  Blood culture showed no growth at this time.  On examination today, patient is much more interactive, has an appetite, urinating well, and is not on any supplemental oxygen.  Patient should continue Tamiflu course in the outpatient setting for three more days.   #AKI  Patient initially presented with creatinine of 2.00 with no history of prior kidney disease per group home leader no previous values to compare to.  AKI likely secondary to infection and dehydration in the setting of poor p.o. intake.  Creatinine on day of discharge is 1.3 after patient was rehydrated in the emergency department.  Bladder scan was negative.  Would recommend outpatient follow-up for repeat labs.  #Diffuse T Wave Inversions Patient had diffuse T wave inversions on EKG, however no previous EKG was used to compare to.  He denies any chest pain, shortness of breath, nausea, vomiting.  Troponins were flat.   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  He was taking risperidone at home for management, continued in the inpatient setting.  #Type 2 Diabetes Mellitus  Unsure exact medications patient receives for his diabetes, or if he is on insulin.  Regardless, was put on a sliding scale and glucose was well in control.  #HFpEF At home, seems patient takes Lasix, carvedilol, digoxin for history of heart failure.  Unable to obtain specific records or echo to confirm the diagnosis.  Lasix was held in the setting of AKI, however rest of his  medications were continued. Would recommend holding Lasix until flu is completely resolved.  Discharge Exam:   BP (!) 143/82 (BP Location: Right Arm)   Pulse 100   Temp 98.1 F (36.7 C) (Oral)   Resp 18   Ht 5\' 8"  (1.727 m)   Wt 61.2 kg   SpO2 95%   BMI 20.53 kg/m  Discharge exam:   Constitutional: Alert gentleman, developmental delay, interactive, able to follow commands with prompting, in no acute distress HENT: normocephalic atraumatic, mucous membranes moist Eyes: conjunctiva non-erythematous Neck: supple Cardiovascular: regular rate and rhythm, no m/r/g Pulmonary/Chest: normal work of breathing on room air, lungs clear to auscultation bilaterally Abdominal: soft, non-tender, non-distended MSK: Atrophy noted in both hands bilaterally Neurological: alert & oriented   Pertinent Labs, Studies, and Procedures:     Latest Ref Rng & Units 07/16/2022    6:41 AM 07/15/2022    5:09 PM 07/15/2022    4:17 PM  BMP  Glucose 70 - 99 mg/dL 98  09/13/2022  185   BUN 6 -  20 mg/dL 25  31  28    Creatinine 0.61 - 1.24 mg/dL  5.40  0.86   Sodium 135 - 145 mmol/L 140  142  140   Potassium 3.5 - 5.1 mmol/L 4.2  4.4  4.5   Chloride 98 - 111 mmol/L 104  104  102   CO2 22 - 32 mmol/L 27   25   Calcium 8.9 - 10.3 mg/dL 7.9   8.4        Latest Ref Rng & Units 07/16/2022    6:41 AM 07/15/2022    5:09 PM 07/15/2022    4:17 PM  CBC  WBC 4.0 - 10.5 K/uL 4.6   11.0   Hemoglobin 13.0 - 17.0 g/dL 09/13/2022  95.0  93.2   Hematocrit 39.0 - 52.0 % 36.2  44.0  44.5   Platelets 150 - 400 K/uL 130   171     DG Chest 2 View  Result Date: 07/16/2022 CLINICAL DATA:  Pneumonia. EXAM: CHEST - 2 VIEW COMPARISON:  July 15, 2022 FINDINGS: Mild opacity in the inferior medial right lower lobe may represent vascular crowding, atelectasis, or subtle infiltrate. The heart, hila, mediastinum, lungs, and pleura are otherwise unremarkable. No other acute abnormalities are identified. IMPRESSION: Mild opacity in the inferior medial  right lower lobe may represent vascular crowding, atelectasis, or subtle infiltrate. Recommend short-term follow-up imaging to ensure resolution. Electronically Signed   By: July 17, 2022 III M.D.   On: 07/16/2022 12:32   CT Head Wo Contrast  Result Date: 07/15/2022 CLINICAL DATA:  Mental status change, unknown cause. EXAM: CT HEAD WITHOUT CONTRAST TECHNIQUE: Contiguous axial images were obtained from the base of the skull through the vertex without intravenous contrast. RADIATION DOSE REDUCTION: This exam was performed according to the departmental dose-optimization program which includes automated exposure control, adjustment of the mA and/or kV according to patient size and/or use of iterative reconstruction technique. COMPARISON:  None Available. FINDINGS: Brain: There is no evidence of an acute infarct, intracranial hemorrhage, mass, midline shift, or extra-axial fluid collection. The ventricles and sulci are within normal limits for age. A small cavum septum pellucidum et vergae is noted. There is somewhat prominent bilateral globus pallidus calcification as well as asymmetrically prominent calcification in the left dentate nucleus. Vascular: No hyperdense vessel. Skull: No fracture or suspicious osseous lesion. Sinuses/Orbits: Trace mucosal thickening or fluid in the included portion of the right maxillary sinus. Underdeveloped mastoid air cells. Unremarkable orbits. Other: None. IMPRESSION: No evidence of acute intracranial abnormality. Electronically Signed   By: 09/13/2022 M.D.   On: 07/15/2022 17:29   DG Chest Port 1 View  Result Date: 07/15/2022 CLINICAL DATA:  Generalized fatigue. EXAM: PORTABLE CHEST 1 VIEW COMPARISON:  None Available. FINDINGS: The heart size and mediastinal contours are within normal limits. Low lung volumes are noted. Mildly increased opacification is noted along the infrahilar region on the right. There is no evidence of a pleural effusion or pneumothorax. The visualized  skeletal structures are unremarkable. IMPRESSION: Findings likely consistent with mild right infrahilar atelectasis versus early infiltrate. Follow-up to resolution is recommended. Electronically Signed   By: 09/13/2022 M.D.   On: 07/15/2022 17:05     Discharge Instructions: Discharge Instructions     Diet - low sodium heart healthy   Complete by: As directed    Increase activity slowly   Complete by: As directed        Signed: 09/13/2022, MD 07/17/2022, 10:47 AM   Pager:  [phone] 

## 2022-07-16 NOTE — Care Management Obs Status (Signed)
[facility] NOTIFICATION   Patient Details  Name: TRAVIAN KERNER MRN: [id number] Date of Birth: 06-23-1971   Medicare Observation Status Notification Given:  Yes    Bartholomew Crews, RN 07/16/2022, 3:45 PM

## 2022-07-16 NOTE — TOC Initial Note (Addendum)
Transition of Care [facility]) - Initial/Assessment Note    Patient Details  Name: Ryan Case MRN: [id number] Date of Birth: 1970/10/26  Transition [facility]) CM/SW Contact:    Bartholomew Crews, RN Phone Number: [phone] 07/16/2022, 3:48 PM  Clinical Narrative:                  Spoke with patient's mom at the bedside to discuss post acute transition. Updated mom's cell phone number. Plan is for patient to return to group home when medically stable. Voicemail left at group home to discuss process for patient to transition back to group home. Confirmed with mom that group home number on file was correct.   UPDATE: Received call back from Warren at the group home. When patient is ready to transition back to group home, will need DC summary, AVS, and prescriptions sent to [employer] [facility]. Group home staff will provide transportation home and bring his clothes to help him get ready to discharge.   Expected Discharge Plan: Group Home Barriers to Discharge: Continued Medical Work up   Patient Goals and CMS Choice Patient states their goals for this hospitalization and ongoing recovery are:: return to the group home CMS Medicare.gov Compare Post Acute Care list provided to:: Legal Guardian Dulce Sellar) Choice offered to / list presented to : NA      Expected Discharge Plan and Services In-house Referral: NA Discharge Planning Services: CM Consult Post Acute Care Choice: NA Living arrangements for the past 2 months: Group Home                 DME Arranged: N/A DME Agency: NA       HH Arranged: NA [place] Agency: NA        Prior Living Arrangements/Services Living arrangements for the past 2 months: Group Home Lives with:: Facility Resident Patient language and need for interpreter reviewed:: Yes Do you feel safe going back to the place where you live?: Yes      Need for Family Participation in Patient Care: Yes (Comment) Care giver support system in place?: Yes  (comment)   Criminal Activity/Legal Involvement Pertinent to Current Situation/Hospitalization: No - Comment as needed  Activities of Daily Living      Permission Sought/Granted                  Emotional Assessment Appearance:: Appears stated age     Orientation: : Oriented to Self Alcohol / Substance Use: Not Applicable Psych Involvement: No (comment)  Admission diagnosis:  Influenza B [J10.1] Sepsis ([place]) [A41.9] Sepsis with acute renal failure without septic shock, due to unspecified organism, unspecified acute renal failure type ([place]) [A41.9, R65.20, N17.9] Patient Active Problem List   Diagnosis Date Noted   Influenza B 07/16/2022   Acute kidney injury ([place]) 07/16/2022   Diabetes mellitus ([place]) 01/04/2011   Sleep apnea 01/04/2011   CHF (congestive heart failure) ([place]) 01/04/2011   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

## 2022-07-16 NOTE — Progress Notes (Signed)
Subjective:   Summary: Ryan Case is a 52 y.o. year old male currently admitted on the IMTS HD#1 for influenza B infection.  Overnight Events: NOE   Pt was seen today bedside. He is much more interactive, and able to respond to commands well. He states that he "wants to go home" and "when is he going home". He denies any pain, vomiting, nausea, or shortness of breath.   Objective:  Vital signs in last 24 hours: Vitals:   07/15/22 1930 07/15/22 1945 07/15/22 2147 07/16/22 0812  BP: (!) 147/79  137/72 (!) 136/50  Pulse: 72 72 80 78  Resp: 19 12 18 20   Temp:    98.9 F (37.2 C)  TempSrc:    Oral  SpO2: 98% 98% 97% 99%  Weight:      Height:       Supplemental O2: Room Air SpO2: 99 %   Physical Exam:  Constitutional: Alert gentleman, developmental delay, able to follow commands, in no acute distress  Cardiovascular: RRR, no murmurs, rubs or gallops Pulmonary/Chest: normal work of breathing on room air, lungs clear to auscultation bilaterally Abdominal: soft, non-tender, non-distended Skin: warm and dry Extremities: upper/lower extremity pulses 2+, no lower extremity edema present  [facility] Weights   07/15/22 1831  Weight: 61.2 kg     Intake/Output Summary (Last 24 hours) at 07/16/2022 1414 Last data filed at 07/16/2022 0500 Gross per 24 hour  Intake 979.66 ml  Output 280 ml  Net 699.66 ml   Net IO Since Admission: 699.66 mL [07/16/22 1414]  Pertinent Labs:    Latest Ref Rng & Units 07/16/2022    6:41 AM 07/15/2022    5:09 PM 07/15/2022    4:17 PM  CBC  WBC 4.0 - 10.5 K/uL 4.6   11.0   Hemoglobin 13.0 - 17.0 g/dL 09/13/2022  50.3  54.6   Hematocrit 39.0 - 52.0 % 36.2  44.0  44.5   Platelets 150 - 400 K/uL 130   171        Latest Ref Rng & Units 07/16/2022    6:41 AM 07/15/2022    5:09 PM 07/15/2022    4:17 PM  CMP  Glucose 70 - 99 mg/dL 98  09/13/2022  127   BUN 6 - 20 mg/dL 25  31  28    Creatinine 0.61 - 1.24 mg/dL 517   0.01   Sodium 135 - 145  mmol/L 140  142  140   Potassium 3.5 - 5.1 mmol/L 4.2  4.4  4.5   Chloride 98 - 111 mmol/L 104  104  102   CO2 22 - 32 mmol/L 27   25   Calcium 8.9 - 10.3 mg/dL 7.9   8.4   Total Protein 6.5 - 8.1 g/dL 5.8   7.2   Total Bilirubin 0.3 - 1.2 mg/dL 0.5   0.6   Alkaline Phos 38 - 126 U/L 57   74   AST 15 - 41 U/L 35   45   ALT 0 - 44 U/L 22   26       Imaging: DG Chest 2 View  Result Date: 07/16/2022 CLINICAL DATA:  Pneumonia. EXAM: CHEST - 2 VIEW COMPARISON:  July 15, 2022 FINDINGS: Mild opacity in the inferior medial right lower lobe may represent vascular crowding, atelectasis, or subtle infiltrate. The heart, hila, mediastinum, lungs, and pleura are otherwise unremarkable. No  other acute abnormalities are identified. IMPRESSION: Mild opacity in the inferior medial right lower lobe may represent vascular crowding, atelectasis, or subtle infiltrate. Recommend short-term follow-up imaging to ensure resolution. Electronically Signed   By: Dorise Bullion III M.D.   On: 07/16/2022 12:32   CT Head Wo Contrast  Result Date: 07/15/2022 CLINICAL DATA:  Mental status change, unknown cause. EXAM: CT HEAD WITHOUT CONTRAST TECHNIQUE: Contiguous axial images were obtained from the base of the skull through the vertex without intravenous contrast. RADIATION DOSE REDUCTION: This exam was performed according to the departmental dose-optimization program which includes automated exposure control, adjustment of the mA and/or kV according to patient size and/or use of iterative reconstruction technique. COMPARISON:  None Available. FINDINGS: Brain: There is no evidence of an acute infarct, intracranial hemorrhage, mass, midline shift, or extra-axial fluid collection. The ventricles and sulci are within normal limits for age. A small cavum septum pellucidum et vergae is noted. There is somewhat prominent bilateral globus pallidus calcification as well as asymmetrically prominent calcification in the left dentate  nucleus. Vascular: No hyperdense vessel. Skull: No fracture or suspicious osseous lesion. Sinuses/Orbits: Trace mucosal thickening or fluid in the included portion of the right maxillary sinus. Underdeveloped mastoid air cells. Unremarkable orbits. Other: None. IMPRESSION: No evidence of acute intracranial abnormality. Electronically Signed   By: Logan Bores M.D.   On: 07/15/2022 17:29   DG Chest Port 1 View  Result Date: 07/15/2022 CLINICAL DATA:  Generalized fatigue. EXAM: PORTABLE CHEST 1 VIEW COMPARISON:  None Available. FINDINGS: The heart size and mediastinal contours are within normal limits. Low lung volumes are noted. Mildly increased opacification is noted along the infrahilar region on the right. There is no evidence of a pleural effusion or pneumothorax. The visualized skeletal structures are unremarkable. IMPRESSION: Findings likely consistent with mild right infrahilar atelectasis versus early infiltrate. Follow-up to resolution is recommended. Electronically Signed   By: Virgina Norfolk M.D.   On: 07/15/2022 17:05     Assessment/Plan:   Principal Problem:   Influenza B Active Problems:   Diabetes mellitus ([employer])   Sleep apnea   Acute kidney injury [facility])   Patient Summary: Ryan Case is a 52 y.o. with a pertinent PMH of mental delay, T2DM, and HFpEF, who presented with decreased functional state and admitted for influenza B infection and AKI.    #Influenza B Infection  Pt initially presented with decreased function status yesterday as well as fever and weakness. He was also tachycardic, and had a leukocytosis with neutrophil predominance. Procalcitonin came back positive, however given limited X-Ray findings antibiotics were discontinued. Pt appears more energetic, talkative, and much more interactive. Mother at bedside when he was re-examined and said he is very much back to his baseline. Vitals have remained within normal limits, and white blood cell count has improved.  Repeat Lactic acids normalized. Blood cultures show no growth after 24 hours.   Plan:  - Tamiflu 30mg  (Day 2)  - PT/OT Eval  #AKI 2/2 to dehydration in the setting of ongoing infection Pt initially presented with creatinine of 2.00, no previous labs to compare to. Initial concern was for urinary retention however bladder scan was negative. Creatinine has since decreased to 1.34 after 2L LR bolus were given yesterday.   Plan:  - Daily BMP  - Consider renal ultrasound if creatinine does not improve  -Holding Lasix   Diet: Normal IVF: None,None Code: Full   Dispo: Anticipated discharge to [facility] in 1 days pending medical  management.Olegario Messier, MD PGY-1 Internal Medicine Resident Pager Number [phone] Please contact the on call pager after 5 pm and on weekends at [phone].

## 2022-07-16 NOTE — Progress Notes (Signed)
New Admission Note:  Arrival Method: By stretcher from ED around 2044 Mental Orientation: Alert and oriented to self Telemetry: CCMD notified Assessment: Completed Skin: Completed, refer to flowsheets IV: R AC S.L. Pain: Denies Tubes: None Safety Measures: Safety Fall Prevention Plan was given, discussed and signed. Admission: Completed [facility] Orientation: Patient has been orientated to the room, unit and the staff. Family: None  Orders have been reviewed and implemented. Will continue to monitor the patient. Call light has been placed within reach and bed alarm has been activated.   Fara Olden, RN  Phone Number: [phone]

## 2022-07-16 NOTE — Evaluation (Signed)
Occupational Therapy Evaluation Patient Details Name: Ryan Case MRN: [id number] DOB: 1971/05/29 Today's Date: 07/16/2022   History of Present Illness Pt is a 52 year old man admitted from the group home of which he has lived since age 34 with sepsis 2/2 flu B presenting with AMS, poor appetite, dusky hands and weakness. Head CT negative for acute changes. PMH: intellectual disability with intermittent explosive behavior, DM2, CHF.   Clinical Impression   Pt seen with mom present. He lives in a group home and manages basic ADLs, standing to shower, and he relies staff for IADLs. Pt was cooperative, although hesitant to walk in the hallway. Ambulated with hand held assist, but primarily to guide him rather than for balance. Pt also very nearsighted with glasses at home. Pt requires up set up to min assist for ADLs. He is likely to progress well and not require post acute OT. Recommend close supervision on stairs and with tub transfers. Pt with complaints of being hungry, provided graham crackers, peanut butter and water while he awaits lunch.   VSS on RA with all activities.      Recommendations for follow up therapy are one component of a multi-disciplinary discharge planning process, led by the attending physician.  Recommendations may be updated based on patient status, additional functional criteria and insurance authorization.   Follow Up Recommendations  No OT follow up     Assistance Recommended at Discharge Frequent or constant Supervision/Assistance  Patient can return home with the following A little help with walking and/or transfers;A little help with bathing/dressing/bathroom;Assistance with cooking/housework;Direct supervision/assist for medications management;Direct supervision/assist for financial management;Assist for transportation;Help with stairs or ramp for entrance    Functional Status Assessment  Patient has had a recent decline in their functional status and  demonstrates the ability to make significant improvements in function in a reasonable and predictable amount of time.  Equipment Recommendations  None recommended by OT    Recommendations for Other Services       Precautions / Restrictions Precautions Precautions: Fall Restrictions Weight Bearing Restrictions: No      Mobility Bed Mobility Overal bed mobility: Needs Assistance Bed Mobility: Supine to Sit     Supine to sit: Supervision     General bed mobility comments: for lines    Transfers Overall transfer level: Needs assistance Equipment used: 1 person hand held assist, None Transfers: Sit to/from Stand Sit to Stand: Min guard, Min assist           General transfer comment: hand held assist to initially stand from bed, no assist to rise from toilet      Balance Overall balance assessment: Needs assistance   Sitting balance-Leahy Scale: Good       Standing balance-Leahy Scale: Fair                             ADL either performed or assessed with clinical judgement   ADL Overall ADL's : Needs assistance/impaired Eating/Feeding: Set up;Sitting   Grooming: Wash/dry hands;Standing;Min guard   Upper Body Bathing: Supervision/ safety;Sitting   Lower Body Bathing: Min guard;Sit to/from stand   Upper Body Dressing : Set up;Sitting   Lower Body Dressing: Set up;Sitting/lateral leans   Toilet Transfer: Min guard;Ambulation;Regular Toilet   Toileting- Clothing Manipulation and Hygiene: Minimal assistance;Sit to/from stand       Functional mobility during ADLs: Minimal assistance;Min guard       Vision Baseline Vision/History: 1 Wears  glasses Ability to See in Adequate Light: 0 Adequate Additional Comments: glasses are not at hospital, pt very nearsighted per mom     Perception     Praxis      Pertinent Vitals/Pain Pain Assessment Pain Assessment: No/denies pain     Hand Dominance Right   Extremity/Trunk Assessment Upper  Extremity Assessment Upper Extremity Assessment: Overall WFL for tasks assessed   Lower Extremity Assessment Lower Extremity Assessment: Defer to PT evaluation   Cervical / Trunk Assessment Cervical / Trunk Assessment: Normal   Communication Communication Communication: HOH;Expressive difficulties   Cognition Arousal/Alertness: Awake/alert Behavior During Therapy: WFL for tasks assessed/performed Overall Cognitive Status: History of cognitive impairments - at baseline                                 General Comments: per mom     General Comments       Exercises     Shoulder Instructions      Home Living Family/patient expects to be discharged to:: Group home Living Arrangements: Group Home Available Help at Discharge: Personal care attendant;Available 24 hours/day Type of Home: House Home Access: Stairs to enter [employer] of Steps: 3   Home Layout: Two level Alternate Level Stairs-Number of Steps: flight Alternate Level Stairs-Rails: Right;Left Bathroom Shower/Tub: Chief Strategy Officer: Standard     Home Equipment: Grab bars - toilet;Grab bars - tub/shower          Prior Functioning/Environment Prior Level of Function : Needs assist             Mobility Comments: walks independently ADLs Comments: can complete basic ADLs with reminders, staff assists with IADLs        OT Problem List: Decreased strength;Decreased activity tolerance;Impaired balance (sitting and/or standing);Decreased cognition      OT Treatment/Interventions: Self-care/ADL training;Patient/family education;Balance training    OT Goals(Current goals can be found in the care plan section) Acute Rehab OT Goals OT Goal Formulation: With patient/family Time For Goal Achievement: 07/30/22 Potential to Achieve Goals: Good ADL Goals Pt Will Perform Grooming: with supervision;standing Pt Will Perform Lower Body Bathing: with supervision;sit  to/from stand Pt Will Perform Lower Body Dressing: with supervision;sit to/from stand Pt Will Transfer to Toilet: with supervision;with mod assist;regular height toilet Pt Will Perform Toileting - Clothing Manipulation and hygiene: with supervision;sit to/from stand Pt Will Perform Tub/Shower Transfer: Tub transfer;with supervision;ambulating  OT Frequency: Min 2X/week    Co-evaluation              AM-PAC OT "6 Clicks" Daily Activity     Outcome Measure Help from another person eating meals?: A Little Help from another person taking care of personal grooming?: A Little Help from another person toileting, which includes using toliet, bedpan, or urinal?: A Little Help from another person bathing (including washing, rinsing, drying)?: A Little Help from another person to put on and taking off regular upper body clothing?: A Little Help from another person to put on and taking off regular lower body clothing?: A Little 6 Click Score: 18   End of Session Equipment Utilized During Treatment: Gait belt  Activity Tolerance: Patient tolerated treatment well Patient left: in bed;with call bell/phone within reach;with family/visitor present  OT Visit Diagnosis: Unsteadiness on feet (R26.81);Other abnormalities of gait and mobility (R26.89);Other symptoms and signs involving cognitive function;Muscle weakness (generalized) (M62.81)  Time: 0175-1025 OT Time Calculation (min): 35 min Charges:  OT General Charges $OT Visit: 1 Visit OT Evaluation $OT Eval Moderate Complexity: 1 Mod OT Treatments $Self Care/Home Management : 8-22 mins  Berna Spare, OTR/L Acute Rehabilitation Services Office: [phone]   Evern Bio 07/16/2022, 1:21 PM

## 2022-07-16 NOTE — Evaluation (Signed)
Physical Therapy Evaluation Patient Details Name: Ryan Case MRN: [id number] DOB: [birth year] Today's Date: 07/16/2022  History of Present Illness  Pt is a 52 year old man admitted from the group home (of which he has lived since age 44) with sepsis 2/2 flu B presenting with AMS, poor appetite, dusky hands and weakness. Head CT negative for acute changes. PMH: intellectual disability with intermittent explosive behavior, DM2, CHF.  Clinical Impression  Per mom's report Pt is weaker than baseline.  She reported that staff at the group home could provide temporary increased supervision and assistance.  PT will follow acutely to encourage continued ambulation and independent mobility.  He is fearful of going into the hallway, so for today we went back and forth from the window to the door in his room.   PT to follow acutely for deficits listed below.        Recommendations for follow up therapy are one component of a multi-disciplinary discharge planning process, led by the attending physician.  Recommendations may be updated based on patient status, additional functional criteria and insurance authorization.  Follow Up Recommendations No PT follow up      Assistance Recommended at Discharge Set up Supervision/Assistance (supervision for bathing, ADLs, and increased supervision from staff when he first gets home.)  Patient can return home with the following  A little help with bathing/dressing/bathroom;Direct supervision/assist for medications management;Direct supervision/assist for financial management;Assistance with cooking/housework;Help with stairs or ramp for entrance;A little help with walking and/or transfers    Equipment Recommendations None recommended by PT  Recommendations for Other Services       Functional Status Assessment Patient has had a recent decline in their functional status and demonstrates the ability to make significant improvements in function in a reasonable and  predictable amount of time.     Precautions / Restrictions Precautions Precautions: Fall Restrictions Weight Bearing Restrictions: No      Mobility  Bed Mobility               General bed mobility comments: NT, pt seated EOB with mom in the room when PT entered and left EOB seated when we were done.    Transfers Overall transfer level: Needs assistance Equipment used: 1 person hand held assist, None Transfers: Sit to/from Stand Sit to Stand: Min guard, Min assist           General transfer comment: hand held assist to initially stand from bed, supervision to rise from the toilet.    Ambulation/Gait Ambulation/Gait assistance: Min guard Gait Distance (Feet): 45 Feet Assistive device: 1 person hand held assist Gait Pattern/deviations: Step-through pattern, Knee flexed in stance - right, Knee flexed in stance - left Gait velocity: decreased Gait velocity interpretation: <1.8 ft/sec, indicate of risk for recurrent falls   General Gait Details: Pt walks with bil knee flexion, trunk ahead of his body at times, does not have his glasses and his vision is poor.  Very reluctant to go into the hallway even with PT/Mom's encouragement.  Pt was agreeable to laps back and forth in the room.  Mom is wondering if his fear of the hallway is because he cannot see well without his glasses (they are at the group home).  Stairs            Wheelchair Mobility    Modified Rankin (Stroke Patients Only)       Balance Overall balance assessment: Needs assistance Sitting-balance support: Feet supported, No upper extremity supported Sitting balance-Leahy Scale:  Good     Standing balance support: Single extremity supported Standing balance-Leahy Scale: Fair Standing balance comment: at times can be supervision, at times min guard assist when on his feet.                             Pertinent Vitals/Pain Pain Assessment Pain Assessment: No/denies pain    Home  Living Family/patient expects to be discharged to:: Group home Living Arrangements: Group Home Available Help at Discharge: Personal care attendant;Available 24 hours/day Type of Home: House Home Access: Stairs to enter   [employer] of Steps: 3 Alternate Level Stairs-Number of Steps: flight Home Layout: Two level Home Equipment: Grab bars - toilet;Grab bars - tub/shower      Prior Function Prior Level of Function : Needs assist             Mobility Comments: walks independently ADLs Comments: can complete basic ADLs with reminders, staff assists with IADLs     Hand Dominance   Dominant Hand: Right    Extremity/Trunk Assessment   Upper Extremity Assessment Upper Extremity Assessment: Defer to OT evaluation    Lower Extremity Assessment Lower Extremity Assessment: Generalized weakness (flexed knee posture in standing, a bit forward preferenced with trunk ahead of body)    Cervical / Trunk Assessment Cervical / Trunk Assessment: Normal  Communication   Communication: HOH;Expressive difficulties  Cognition Arousal/Alertness: Awake/alert Behavior During Therapy: WFL for tasks assessed/performed, Anxious (fearful of going into the hallway, but also does not have glasses, so cannot see very well.) Overall Cognitive Status: History of cognitive impairments - at baseline                                 General Comments: per mom        General Comments General comments (skin integrity, edema, etc.): allowed pt to attempt to care for himself as much as possible throughout session including donning his own socks and shoes, putting on shorts, and performing peri care after using the bathroom.  He needed min assist at times for all but the peri care.  He was able to perform peri care with supervision.    Exercises     Assessment/Plan    PT Assessment Patient needs continued PT services  PT Problem List Decreased strength;Decreased activity  tolerance;Decreased balance;Decreased mobility;Decreased knowledge of use of DME       PT Treatment Interventions DME instruction;Gait training;Stair training;Functional mobility training;Therapeutic activities;Therapeutic exercise;Balance training;Patient/family education    PT Goals (Current goals can be found in the Care Plan section)  Acute Rehab PT Goals Patient Stated Goal: to go home with increased supervision from group home staff initially PT Goal Formulation: With family Time For Goal Achievement: 07/30/22 Potential to Achieve Goals: Good    Frequency Min 3X/week     Co-evaluation               AM-PAC PT "6 Clicks" Mobility  Outcome Measure Help needed turning from your back to your side while in a flat bed without using bedrails?: A Little Help needed moving from lying on your back to sitting on the side of a flat bed without using bedrails?: A Little Help needed moving to and from a bed to a chair (including a wheelchair)?: A Little Help needed standing up from a chair using your arms (e.g., wheelchair or bedside chair)?: A Little Help  needed to walk in hospital room?: A Little Help needed climbing 3-5 steps with a railing? : A Little 6 Click Score: 18    End of Session   Activity Tolerance: Patient limited by fatigue Patient left: in bed;with call bell/phone within reach;with family/visitor present;Other (comment) (seated EOB with mom in room)   PT Visit Diagnosis: Muscle weakness (generalized) (M62.81);Difficulty in walking, not elsewhere classified (R26.2)    Time: [id number] PT Time Calculation (min) (ACUTE ONLY): 41 min   Charges:   PT Evaluation $PT Eval Moderate Complexity: 1 Mod PT Treatments $Gait Training: 8-22 mins $Therapeutic Activity: 8-22 mins       Verdene Lennert, PT, DPT  Acute Rehabilitation Secure chat is best for contact #[phone] office

## 2022-07-17 DIAGNOSIS — Z87891 Personal history of nicotine dependence: Secondary | ICD-10-CM | POA: Diagnosis not present

## 2022-07-17 DIAGNOSIS — J101 Influenza due to other identified influenza virus with other respiratory manifestations: Secondary | ICD-10-CM | POA: Diagnosis not present

## 2022-07-17 DIAGNOSIS — J1089 Influenza due to other identified influenza virus with other manifestations: Secondary | ICD-10-CM | POA: Diagnosis not present

## 2022-07-17 LAB — GLUCOSE, CAPILLARY
Glucose-Capillary: 205 mg/dL — ABNORMAL HIGH (ref 70–99)
Glucose-Capillary: 298 mg/dL — ABNORMAL HIGH (ref 70–99)

## 2022-07-17 LAB — HEMOGLOBIN A1C
Hgb A1c MFr Bld: 7 % — ABNORMAL HIGH (ref 4.8–5.6)
Mean Plasma Glucose: 154 mg/dL

## 2022-07-17 MED ORDER — OSELTAMIVIR PHOSPHATE 30 MG PO CAPS: 30.0000 mg | ORAL_CAPSULE | Freq: Two times a day (BID) | ORAL | 0 refills | Status: DC

## 2022-07-17 NOTE — Discharge Instructions (Signed)
It was a pleasure taking care of you in the hospital.  You were admitted for an influenza infection.  After some fluids, and starting a medication called Tamiflu seems like you are able to get better quickly. You have two more days of the tamiflu, and I will send the rest to your pharmacy. It is very important that you follow up with your primary care provider. I am stopping your lasix until you get better from the flu. Take Care!

## 2022-07-17 NOTE — Progress Notes (Signed)
Occupational Therapy Treatment Patient Details Name: Ryan Case MRN: [id number] DOB: 1970/10/27 Today's Date: 07/17/2022   History of present illness Pt is a 52 year old man admitted from the group home (of which he has lived since age 3) with sepsis 2/2 flu B presenting with AMS, poor appetite, dusky hands and weakness. Head CT negative for acute changes. PMH: intellectual disability with intermittent explosive behavior, DM2, CHF.   OT comments  Pt a little more sleepy today, mom thinks it may be because his home meds were restarted. Pt needing min assist to initiate sit to stand and min to min guard for ambulation to bathroom and to wash his hands. Pt highly focused on being hungry. Set up to eat in chair. Pt to d/c home today.    Recommendations for follow up therapy are one component of a multi-disciplinary discharge planning process, led by the attending physician.  Recommendations may be updated based on patient status, additional functional criteria and insurance authorization.    Follow Up Recommendations  No OT follow up     Assistance Recommended at Discharge Frequent or constant Supervision/Assistance  Patient can return home with the following  A little help with walking and/or transfers;A little help with bathing/dressing/bathroom;Assistance with cooking/housework;Direct supervision/assist for medications management;Direct supervision/assist for financial management;Assist for transportation;Help with stairs or ramp for entrance   Equipment Recommendations  None recommended by OT    Recommendations for Other Services      Precautions / Restrictions Precautions Precautions: Fall Restrictions Weight Bearing Restrictions: No       Mobility Bed Mobility               General bed mobility comments: up in chair with mom present    Transfers Overall transfer level: Needs assistance Equipment used: 1 person hand held assist, None Transfers: Sit to/from  Stand Sit to Stand: Min guard, Min assist           General transfer comment: hand held to initiate mobilizing     Balance Overall balance assessment: Needs assistance   Sitting balance-Leahy Scale: Good     Standing balance support: No upper extremity supported Standing balance-Leahy Scale: Fair                             ADL either performed or assessed with clinical judgement   ADL Overall ADL's : Needs assistance/impaired Eating/Feeding: Set up;Sitting Eating/Feeding Details (indicate cue type and reason): graham crackers, pb and diet soda Grooming: Supervision/safety;Standing                               Functional mobility during ADLs: Minimal assistance;Min guard      Extremity/Trunk Assessment              Vision       Perception     Praxis      Cognition Arousal/Alertness: Awake/alert, Lethargic, Suspect due to medications Behavior During Therapy: Flat affect Overall Cognitive Status: History of cognitive impairments - at baseline                                          Exercises      Shoulder Instructions       General Comments      Pertinent Vitals/ Pain  Pain Assessment Pain Assessment: Faces Faces Pain Scale: No hurt  Home Living                                          Prior Functioning/Environment              Frequency  Min 2X/week        Progress Toward Goals  OT Goals(current goals can now be found in the care plan section)  Progress towards OT goals: Progressing toward goals  Acute Rehab OT Goals OT Goal Formulation: With patient/family Time For Goal Achievement: 07/30/22 Potential to Achieve Goals: Good  Plan Discharge plan remains appropriate    Co-evaluation                 AM-PAC OT "6 Clicks" Daily Activity     Outcome Measure   Help from another person eating meals?: A Little Help from another person taking care of  personal grooming?: A Little Help from another person toileting, which includes using toliet, bedpan, or urinal?: A Little Help from another person bathing (including washing, rinsing, drying)?: A Little Help from another person to put on and taking off regular upper body clothing?: A Little Help from another person to put on and taking off regular lower body clothing?: A Little 6 Click Score: 18    End of Session Equipment Utilized During Treatment: Gait belt  OT Visit Diagnosis: Unsteadiness on feet (R26.81);Other abnormalities of gait and mobility (R26.89);Other symptoms and signs involving cognitive function;Muscle weakness (generalized) (M62.81)   Activity Tolerance Patient tolerated treatment well   Patient Left in chair;with call bell/phone within reach;with chair alarm set;with family/visitor present   Nurse Communication          Time: 1443-1540 OT Time Calculation (min): 14 min  Charges: OT General Charges $OT Visit: 1 Visit OT Treatments $Self Care/Home Management : 8-22 mins  Berna Spare, OTR/L Acute Rehabilitation Services Office: [phone]  Evern Bio 07/17/2022, 10:59 AM

## 2022-07-20 LAB — CULTURE, BLOOD (ROUTINE X 2)
Culture: NO GROWTH
Culture: NO GROWTH
Special Requests: ADEQUATE
Special Requests: ADEQUATE

## 2022-10-11 ENCOUNTER — Encounter: Payer: Self-pay | Admitting: Podiatry

## 2022-10-11 ENCOUNTER — Ambulatory Visit (INDEPENDENT_AMBULATORY_CARE_PROVIDER_SITE_OTHER): Payer: Medicare Other | Admitting: Podiatry

## 2022-10-11 DIAGNOSIS — E119 Type 2 diabetes mellitus without complications: Secondary | ICD-10-CM

## 2022-10-11 DIAGNOSIS — M79675 Pain in left toe(s): Secondary | ICD-10-CM

## 2022-10-11 DIAGNOSIS — Q828 Other specified congenital malformations of skin: Secondary | ICD-10-CM | POA: Insufficient documentation

## 2022-10-11 DIAGNOSIS — B351 Tinea unguium: Secondary | ICD-10-CM

## 2022-10-11 DIAGNOSIS — M79674 Pain in right toe(s): Secondary | ICD-10-CM | POA: Diagnosis not present

## 2022-10-11 NOTE — Progress Notes (Signed)
This patient returns to my office for at risk foot care.  This patient requires this care by a professional since this patient will be at risk due to having diabetes.  He is taking xarelto. This patient is unable to cut nails himself since the patient cannot reach his nails.These nails are painful walking and wearing shoes. He presents with his caregiver. This patient presents for at risk foot care today.  General Appearance  Alert, conversant and in no acute stress.  Vascular  Dorsalis pedis and posterior tibial  pulses are palpable  bilaterally.  Capillary return is within normal limits  bilaterally. Temperature is within normal limits  bilaterally.  Neurologic  Senn-Weinstein monofilament wire test within normal limits  bilaterally. Muscle power within normal limits bilaterally.  Nails Thick disfigured discolored nails with subungual debris  from hallux to fifth toes bilaterally. No evidence of bacterial infection or drainage bilaterally.  Orthopedic  No limitations of motion  feet .  No crepitus or effusions noted.  No bony pathology or digital deformities noted.  Skin  normotropic skin with no porokeratosis noted bilaterally.  No signs of infections or ulcers noted.     Onychomycosis  Pain in right toes  Pain in left toes  Consent was obtained for treatment procedures.   Mechanical debridement of nails 1-5  bilaterally performed with a nail nipper.  Filed with dremel without incident.    Return office visit     3 months                 Told patient to return for periodic foot care and evaluation due to potential at risk complications.   Gardiner Barefoot DPM

## 2023-01-10 ENCOUNTER — Encounter: Payer: Self-pay | Admitting: Podiatry

## 2023-01-10 ENCOUNTER — Ambulatory Visit (INDEPENDENT_AMBULATORY_CARE_PROVIDER_SITE_OTHER): Payer: Medicare Other | Admitting: Podiatry

## 2023-01-10 DIAGNOSIS — E119 Type 2 diabetes mellitus without complications: Secondary | ICD-10-CM | POA: Diagnosis not present

## 2023-01-10 DIAGNOSIS — M79674 Pain in right toe(s): Secondary | ICD-10-CM | POA: Diagnosis not present

## 2023-01-10 DIAGNOSIS — B351 Tinea unguium: Secondary | ICD-10-CM

## 2023-01-10 DIAGNOSIS — M79675 Pain in left toe(s): Secondary | ICD-10-CM | POA: Diagnosis not present

## 2023-01-10 NOTE — Progress Notes (Signed)
This patient returns to my office for at risk foot care.  This patient requires this care by a professional since this patient will be at risk due to having diabetes.  He is taking xarelto. This patient is unable to cut nails himself since the patient cannot reach his nails.These nails are painful walking and wearing shoes. He presents with his caregiver. This patient presents for at risk foot care today.  General Appearance  Alert, conversant and in no acute stress.  Vascular  Dorsalis pedis and posterior tibial  pulses are palpable  bilaterally.  Capillary return is within normal limits  bilaterally. Temperature is within normal limits  bilaterally.  Neurologic  Senn-Weinstein monofilament wire test within normal limits  bilaterally. Muscle power within normal limits bilaterally.  Nails Thick disfigured discolored nails with subungual debris  from hallux to fifth toes bilaterally. No evidence of bacterial infection or drainage bilaterally.  Orthopedic  No limitations of motion  feet .  No crepitus or effusions noted.  No bony pathology or digital deformities noted.  Skin  normotropic skin with no porokeratosis noted bilaterally.  No signs of infections or ulcers noted.     Onychomycosis  Pain in right toes  Pain in left toes  Consent was obtained for treatment procedures.   Mechanical debridement of nails 1-5  bilaterally performed with a nail nipper.  Filed with dremel without incident.    Return office visit     3 months                 Told patient to return for periodic foot care and evaluation due to potential at risk complications.   Aleaha Fickling DPM   

## 2023-04-13 ENCOUNTER — Ambulatory Visit (INDEPENDENT_AMBULATORY_CARE_PROVIDER_SITE_OTHER): Payer: Medicare Other | Admitting: Podiatry

## 2023-04-13 ENCOUNTER — Encounter: Payer: Self-pay | Admitting: Podiatry

## 2023-04-13 DIAGNOSIS — M79674 Pain in right toe(s): Secondary | ICD-10-CM

## 2023-04-13 DIAGNOSIS — B351 Tinea unguium: Secondary | ICD-10-CM

## 2023-04-13 DIAGNOSIS — E119 Type 2 diabetes mellitus without complications: Secondary | ICD-10-CM

## 2023-04-13 DIAGNOSIS — M79675 Pain in left toe(s): Secondary | ICD-10-CM | POA: Diagnosis not present

## 2023-04-13 NOTE — Progress Notes (Signed)
This patient returns to my office for at risk foot care.  This patient requires this care by a professional since this patient will be at risk due to having diabetes.  He is taking xarelto. This patient is unable to cut nails himself since the patient cannot reach his nails.These nails are painful walking and wearing shoes. He presents with his caregiver. This patient presents for at risk foot care today.  General Appearance  Alert, conversant and in no acute stress.  Vascular  Dorsalis pedis and posterior tibial  pulses are palpable  bilaterally.  Capillary return is within normal limits  bilaterally. Temperature is within normal limits  bilaterally.  Neurologic  Senn-Weinstein monofilament wire test within normal limits  bilaterally. Muscle power within normal limits bilaterally.  Nails Thick disfigured discolored nails with subungual debris  from hallux to fifth toes bilaterally. No evidence of bacterial infection or drainage bilaterally.  Orthopedic  No limitations of motion  feet .  No crepitus or effusions noted.  No bony pathology or digital deformities noted.  Skin  normotropic skin with no porokeratosis noted bilaterally.  No signs of infections or ulcers noted.     Onychomycosis  Pain in right toes  Pain in left toes  Consent was obtained for treatment procedures.   Mechanical debridement of nails 1-5  bilaterally performed with a nail nipper.  Filed with dremel without incident.    Return office visit     3 months                 Told patient to return for periodic foot care and evaluation due to potential at risk complications.   Aleaha Fickling DPM   

## 2023-04-18 ENCOUNTER — Encounter (HOSPITAL_COMMUNITY): Payer: Self-pay | Admitting: Emergency Medicine

## 2023-04-18 ENCOUNTER — Ambulatory Visit (HOSPITAL_COMMUNITY)
Admission: EM | Admit: 2023-04-18 | Discharge: 2023-04-18 | Disposition: A | Discharge status: Home / Self Care | Payer: Medicare Other | Attending: Sports Medicine | Admitting: Sports Medicine

## 2023-04-18 DIAGNOSIS — B309 Viral conjunctivitis, unspecified: Secondary | ICD-10-CM | POA: Diagnosis not present

## 2023-04-18 MED ORDER — HYPROMELLOSE (GONIOSCOPIC) 2.5 % OP SOLN: 1.0000 [drp] | Freq: Four times a day (QID) | OPHTHALMIC | 12 refills | Status: DC | PRN

## 2023-04-18 MED ORDER — OLOPATADINE HCL 0.2 % OP SOLN: 1.0000 [drp] | Freq: Every day | OPHTHALMIC | 0 refills | Status: AC

## 2023-04-18 NOTE — Discharge Instructions (Addendum)
Nain has viral conjunctivitis. This is very contagious so I recommend good hand hygiene to limit spread. Avoid sharing personal items (towels, pillows, cosmetics, etc) with others.  This is best managed with symptomatic care. Discomfort and swelling can be managed with alternation between warm and cold compression with wet cloth. Can do topical artificial tears or antihistamine drops which can be found over the counter. See attached form for examples of these. Use antihistamine eye drops once daily, however artificial tears can be used hourly if desired.

## 2023-04-18 NOTE — ED Provider Notes (Signed)
Systems reviewed and negative [facility]    CSN: [id number] Arrival date & time: 04/18/23  4098      History   Chief Complaint Chief Complaint  Patient presents with   Eye Drainage    HPI Ryan Case is a 52 y.o. male.   Mclane the 52 year old male with history of IDD accompanied by his group home caregiver with chief complaint of left eye redness, discharge, and itching.  Onset was yesterday though notes that this is improved over the past 24 hours.  He has taken nothing for using eyedrops since onset.  He denies blurring of his vision or pain within the eye.  No systemic fevers, chills, sore throat, sinus congestion, cough.  The history is provided by the patient and a caregiver. The history is limited by a developmental delay.    Past Medical History:  Diagnosis Date   CHF (congestive heart failure) (HCC)    Diabetes mellitus    GERD (gastroesophageal reflux disease)    Heart attack (HCC)    History of bladder problems    Hypertension    Intermittent explosive disorder    Sinus complaint    Sleep apnea    Visual impairment     Patient Active Problem List   Diagnosis Date Noted   Porokeratosis 10/11/2022   Influenza B 07/16/2022   Acute kidney injury (HCC) 07/16/2022   Diabetes mellitus (HCC) 01/04/2011   Sleep apnea 01/04/2011   CHF (congestive heart failure) (HCC) 01/04/2011   Mild intellectual disability 01/04/2011   Hypertension 01/04/2011   Anxiety 01/04/2011   Bruises easily 01/04/2011   GERD (gastroesophageal reflux disease) 01/04/2011   Asthma 01/04/2011    History reviewed. No pertinent surgical history.     Home Medications    Prior to Admission medications   Medication Sig Start Date End Date Taking? Authorizing Provider  hydroxypropyl methylcellulose / hypromellose (ISOPTO TEARS / GONIOVISC) 2.5 % ophthalmic solution Place 1 drop into the left eye 4 (four) times daily as needed for dry eyes. 04/18/23  Yes Marisa Cyphers, MD   Olopatadine HCl 0.2 % SOLN Apply 1 drop to eye daily for 5 days. 04/18/23 04/23/23 Yes Marisa Cyphers, MD  ACCU-CHEK AVIVA PLUS test strip check blood sugar TWICE DAILY 10/21/21   [provider]  albuterol (VENTOLIN HFA) 108 (90 Base) MCG/ACT inhaler Inhale 2 puffs into the lungs every 4 (four) hours as needed. 03/08/20   [provider]  amantadine (SYMMETREL) 100 MG capsule Take 100 mg by mouth every morning. 06/29/22   [provider]  amitriptyline (ELAVIL) 25 MG tablet Take 25 mg by mouth at bedtime.      [provider]  carvedilol (COREG) 3.125 MG tablet Take 3.125 mg by mouth 2 (two) times daily with a meal.      [provider]  desmopressin (DDAVP) 0.2 MG tablet TAKE TWO TABLETS EVERY DAY Patient taking differently: Take 0.4 mg by mouth at bedtime. 03/24/21   McKenzie, Mardene Celeste, MD  digoxin (LANOXIN) 0.25 MG tablet Take 250 mcg by mouth daily.      [provider]  famotidine (PEPCID) 20 MG tablet Take 20 mg by mouth at bedtime.    [provider]  gabapentin (NEURONTIN) 400 MG capsule Take 1,200 mg by mouth at bedtime. Takes all three capsules at bedtime.    [provider]  glipiZIDE-metformin (METAGLIP) 5-500 MG tablet Take 1 tablet by mouth daily. 07/31/19   [provider]  LORazepam (ATIVAN) 1 MG tablet Take 1 mg by mouth at bedtime.    [provider]  neomycin-polymyxin-pramoxine (NEOSPORIN PLUS) 1 % cream Apply to left foot lacerations once daily until healed. Patient not taking: Reported on 07/15/2022 03/31/20   Freddie Breech, DPM  oseltamivir (TAMIFLU) 30 MG capsule Take 1 capsule (30 mg total) by mouth 2 (two) times daily. Take 1 capsule tonight, then one capsule twice daily for two more days. 07/17/22   Dickie La, MD  potassium chloride (KLOR-CON) 10 MEQ tablet Take 20 mEq by mouth daily. 03/08/20   [provider]  Promethazine HCl 6.25 MG/5ML SOLN SMARTSIG:1 Teaspoon By  Mouth Every 4-6 Hours PRN 03/23/21   [provider]  QC LO-DOSE ASPIRIN 81 MG EC tablet Take 81 mg by mouth daily. 07/31/19   [provider]  risperiDONE (RISPERDAL) 3 MG tablet Take 3 mg by mouth 2 (two) times daily.      [provider]  SYSTANE COMPLETE PF 0.6 % SOLN Place 1 drop into both eyes 6 (six) times daily. 06/28/22   [provider]  tamsulosin (FLOMAX) 0.4 MG CAPS capsule Take 0.4 mg by mouth at bedtime. 07/24/21   [provider]  theophylline (UNIPHYL) 400 MG 24 hr tablet Take 400 mg by mouth daily. 09/15/19   [provider]  Transparent Dressings (NEXCARE TEGADERM 4"X4-3/4") MISC Apply topically as directed. 03/16/20   [provider]  traZODone (DESYREL) 50 MG tablet Take 50 mg by mouth at bedtime. 07/13/18   [provider]    Family History Family History  Problem Relation Age of Onset   Arthritis Other    Cancer Other    Diabetes Other    Hypertension Other    Gout Other     Social History Social History   Tobacco Use   Smoking status: Former   Smokeless tobacco: Never  Vaping Use   Vaping status: Never Used  Substance Use Topics   Alcohol use: No   Drug use: No     Allergies   Patient has no known allergies.   Review of Systems Review of Systems reviewed and negative except as documented in HPI.   Physical Exam  Updated Vital Signs BP (!) 179/105 (BP Location: Left Arm)   Pulse 77   Temp 98.4 F (36.9 C) (Oral)   Resp 18   SpO2 96%    Physical Exam Constitutional:      General: He is not in acute distress.    Appearance: Normal appearance. He is normal weight. He is not ill-appearing.  HENT:     Head: Normocephalic and atraumatic.     Nose: Nose normal.     Mouth/Throat:     Mouth: Mucous membranes are moist.     Pharynx: Oropharynx is clear.  Eyes:     General:        Left eye: Discharge present.    Extraocular Movements: Extraocular movements intact.     Pupils:  Pupils are equal, round, and reactive to light.     Comments: Hyperemia of left conjunctiva with some clear/yellow discharge present and crusting along the medial eye lid fold.  Mild swelling of superior lid, no swelling of lower lid.  No lid lesions present.  No pain with extraocular eye movements.  Visual acuity grossly intact.  Musculoskeletal:     Cervical back: Normal range of motion and neck supple.  Skin:    General: Skin is warm and dry.  Findings: No rash.  Neurological:     General: No focal deficit present.     Mental Status: He is alert. Mental status is at baseline.     Comments: Intellectual disability present  Psychiatric:        Mood and Affect: Mood normal.        Behavior: Behavior normal.      UC Treatments / Results  Labs (all labs ordered are listed, but only abnormal results are displayed) Labs Reviewed - No data to display  EKG   Radiology No results found.  Procedures Procedures (including critical care time)  Medications Ordered in UC Medications - No data to display  Initial Impression / Assessment and Plan / UC Course  I have reviewed the triage vital signs and the nursing notes.  Pertinent labs & imaging results that were available during my care of the patient were reviewed by me and considered in my medical decision making (see chart for details).     Vitals and triage reviewed, patient is hemodynamically stable.  Presentation most consistent with viral conjunctivitis of the left eye.  No red flag symptoms.  I advised supportive care measures with artificial tears as needed, alternating warm and cool compress as needed, and antihistamines drops once daily for the next 3 to 5 days as needed for eye itching.  Return precautions reviewed with patient and his caregiver.  They expressed understanding and in agreement with the plan.  Group home paperwork completed prior to completion of today's visit.  Final Clinical Impressions(s) / UC Diagnoses    Final diagnoses:  Viral conjunctivitis of left eye     Discharge Instructions      Ryan Case has viral conjunctivitis. This is very contagious so I recommend good hand hygiene to limit spread. Avoid sharing personal items (towels, pillows, cosmetics, etc) with others.  This is best managed with symptomatic care. Discomfort and swelling can be managed with alternation between warm and cold compression with wet cloth. Can do topical artificial tears or antihistamine drops which can be found over the counter. See attached form for examples of these. Use antihistamine eye drops once daily, however artificial tears can be used hourly if desired.   ED Prescriptions     Medication Sig Dispense Auth. Provider   hydroxypropyl methylcellulose / hypromellose (ISOPTO TEARS / GONIOVISC) 2.5 % ophthalmic solution Place 1 drop into the left eye 4 (four) times daily as needed for dry eyes. 15 mL Marisa Cyphers, MD   Olopatadine HCl 0.2 % SOLN Apply 1 drop to eye daily for 5 days. 2.5 mL Marisa Cyphers, MD      PDMP not reviewed this encounter.   Marisa Cyphers, MD 04/18/23 1101

## 2023-07-20 ENCOUNTER — Ambulatory Visit: Payer: Medicare Other | Admitting: Podiatry

## 2023-07-23 ENCOUNTER — Ambulatory Visit: Payer: Medicare Other | Admitting: Podiatry

## 2023-10-12 ENCOUNTER — Emergency Department (HOSPITAL_COMMUNITY)
Admission: EM | Admit: 2023-10-12 | Discharge: 2023-10-12 | Attending: Emergency Medicine | Admitting: Emergency Medicine

## 2023-10-12 ENCOUNTER — Encounter (HOSPITAL_COMMUNITY): Payer: Self-pay | Admitting: Emergency Medicine

## 2023-10-12 ENCOUNTER — Other Ambulatory Visit: Payer: Self-pay

## 2023-10-12 DIAGNOSIS — Z7984 Long term (current) use of oral hypoglycemic drugs: Secondary | ICD-10-CM | POA: Insufficient documentation

## 2023-10-12 DIAGNOSIS — E119 Type 2 diabetes mellitus without complications: Secondary | ICD-10-CM | POA: Diagnosis not present

## 2023-10-12 DIAGNOSIS — Z041 Encounter for examination and observation following transport accident: Secondary | ICD-10-CM | POA: Insufficient documentation

## 2023-10-12 DIAGNOSIS — Y92410 Unspecified street and highway as the place of occurrence of the external cause: Secondary | ICD-10-CM | POA: Diagnosis not present

## 2023-10-12 DIAGNOSIS — I509 Heart failure, unspecified: Secondary | ICD-10-CM | POA: Insufficient documentation
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
## 2023-12-17 ENCOUNTER — Ambulatory Visit: Admitting: Podiatry

## 2023-12-26 ENCOUNTER — Ambulatory Visit (INDEPENDENT_AMBULATORY_CARE_PROVIDER_SITE_OTHER): Admitting: Podiatry

## 2023-12-26 ENCOUNTER — Encounter: Payer: Self-pay | Admitting: Podiatry

## 2023-12-26 DIAGNOSIS — B351 Tinea unguium: Secondary | ICD-10-CM

## 2023-12-26 DIAGNOSIS — E119 Type 2 diabetes mellitus without complications: Secondary | ICD-10-CM

## 2023-12-26 DIAGNOSIS — M79674 Pain in right toe(s): Secondary | ICD-10-CM

## 2023-12-26 DIAGNOSIS — M79675 Pain in left toe(s): Secondary | ICD-10-CM

## 2023-12-26 NOTE — Progress Notes (Signed)
This patient returns to my office for at risk foot care.  This patient requires this care by a professional since this patient will be at risk due to having diabetes.  He is taking xarelto. This patient is unable to cut nails himself since the patient cannot reach his nails.These nails are painful walking and wearing shoes. He presents with his caregiver. This patient presents for at risk foot care today.  General Appearance  Alert, conversant and in no acute stress.  Vascular  Dorsalis pedis and posterior tibial  pulses are palpable  bilaterally.  Capillary return is within normal limits  bilaterally. Temperature is within normal limits  bilaterally.  Neurologic  Senn-Weinstein monofilament wire test within normal limits  bilaterally. Muscle power within normal limits bilaterally.  Nails Thick disfigured discolored nails with subungual debris  from hallux to fifth toes bilaterally. No evidence of bacterial infection or drainage bilaterally.  Orthopedic  No limitations of motion  feet .  No crepitus or effusions noted.  No bony pathology or digital deformities noted.  Skin  normotropic skin with no porokeratosis noted bilaterally.  No signs of infections or ulcers noted.     Onychomycosis  Pain in right toes  Pain in left toes  Consent was obtained for treatment procedures.   Mechanical debridement of nails 1-5  bilaterally performed with a nail nipper.  Filed with dremel without incident.    Return office visit     3 months                 Told patient to return for periodic foot care and evaluation due to potential at risk complications.   Aleaha Fickling DPM   

## 2024-03-27 ENCOUNTER — Ambulatory Visit: Admitting: Podiatry

## 2024-05-21 ENCOUNTER — Ambulatory Visit: Admitting: Podiatry

## 2024-06-25 ENCOUNTER — Encounter (HOSPITAL_COMMUNITY): Payer: Self-pay | Admitting: Emergency Medicine

## 2024-06-25 ENCOUNTER — Ambulatory Visit (HOSPITAL_COMMUNITY)
Admission: EM | Admit: 2024-06-25 | Discharge: 2024-06-25 | Disposition: A | Discharge status: Home / Self Care | Source: Home / Self Care | Attending: Internal Medicine | Admitting: Internal Medicine

## 2024-06-25 DIAGNOSIS — B9689 Other specified bacterial agents as the cause of diseases classified elsewhere: Secondary | ICD-10-CM

## 2024-06-25 DIAGNOSIS — R0981 Nasal congestion: Secondary | ICD-10-CM | POA: Diagnosis not present

## 2024-06-25 DIAGNOSIS — J019 Acute sinusitis, unspecified: Secondary | ICD-10-CM

## 2024-06-25 LAB — POC COVID19/FLU A&B COMBO
Covid Antigen, POC: NEGATIVE
Influenza A Antigen, POC: NEGATIVE
Influenza B Antigen, POC: NEGATIVE

## 2024-06-25 MED ORDER — DOXYCYCLINE HYCLATE 100 MG PO CAPS: 100.0000 mg | ORAL_CAPSULE | Freq: Two times a day (BID) | ORAL | 0 refills | Status: AC

## 2024-06-25 NOTE — ED Provider Notes (Signed)
 [facility]    CSN: [id number] Arrival date & time: 06/25/24  9048      History   Chief Complaint Chief Complaint  Patient presents with   Cough    HPI WILLIAMSON CAVANAH is a 53 y.o. male presenting to urgent care recommended by his caregiver endorsing a 2-3-day history of dry cough and sinus congestion productive of dark yellow/green secretions.  He lives in a group home and his caregiver reports that another resident has been ill with similar symptoms recently.  He is currently being treated with antibiotics.  Mr. Fleener caregiver noted that his fingers were discolored and cold this morning, prompting him to come to urgent care.  Mr. Mcparland denies shortness of breath.  He has not had fever.  He has not been given any medications for symptom relief.  His caregiver states that Mr. Sciandra has had similar discoloration and coldness in his hands during the summer.  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 No pertinent surgical history.   Home Medications    Prior to Admission medications  Medication Sig Start Date End Date Taking? Authorizing Provider  amitriptyline  (ELAVIL ) 50 MG tablet Take 50 mg by mouth at bedtime. 06/03/24  Yes [provider]  doxycycline  (VIBRAMYCIN ) 100 MG capsule Take 1 capsule (100 mg total) by mouth 2 (two) times daily for 5 days. 06/25/24 06/30/24 Yes Melvenia Manus BRAVO, MD  furosemide (LASIX) 40 MG tablet Take 40 mg by mouth daily. 04/22/24  Yes [provider]  theophylline  (THEODUR) 450 MG 12 hr tablet Take 450 mg by mouth daily. 06/18/24  Yes [provider]  ACCU-CHEK AVIVA PLUS test strip check blood sugar TWICE DAILY 10/21/21   [provider]  albuterol  (VENTOLIN  HFA) 108 (90 Base) MCG/ACT inhaler Inhale 2 puffs into the lungs every 4 (four) hours as needed. 03/08/20   [provider]  amantadine  (SYMMETREL ) 100 MG capsule Take 100 mg by mouth every morning. 06/29/22   [provider]  amitriptyline  (ELAVIL ) 25 MG tablet Take 25 mg by mouth at bedtime.      [provider]  carvedilol  (COREG ) 3.125 MG tablet Take 3.125 mg by mouth 2 (two) times daily with a meal.      [provider]  desmopressin  (DDAVP ) 0.2 MG tablet TAKE TWO TABLETS EVERY DAY Patient taking differently: Take 0.4 mg by mouth at bedtime. 03/24/21   McKenzie, Belvie LITTIE, MD  digoxin  (LANOXIN ) 0.25 MG tablet Take 250 mcg by mouth daily.      [provider]  famotidine  (PEPCID ) 20 MG tablet Take 20 mg by mouth at bedtime.    [provider]  gabapentin  (NEURONTIN ) 400  MG capsule Take 1,200 mg by mouth at bedtime. Takes all three capsules at bedtime.    [provider]  glipiZIDE -metformin  (METAGLIP ) 5-500 MG tablet Take 1 tablet by mouth daily. 07/31/19   [provider]  hydroxypropyl methylcellulose / hypromellose (ISOPTO TEARS / GONIOVISC) 2.5 % ophthalmic solution Place 1 drop into the left eye 4 (four) times daily as needed for dry eyes. 04/18/23   Currieo, Andrew D, MD  LORazepam  (ATIVAN ) 1 MG tablet Take 1 mg by mouth at bedtime.    [provider]  neomycin-polymyxin-pramoxine (NEOSPORIN  PLUS) 1 % cream Apply to left  foot lacerations once daily until healed. 03/31/20   Gaynel Delon CROME, DPM  oseltamivir  (TAMIFLU ) 30 MG capsule Take 1 capsule (30 mg total) by mouth 2 (two) times daily. Take 1 capsule tonight, then one capsule twice daily for two more days. 07/17/22   Karna Fellows, MD  potassium chloride  (KLOR-CON ) 10 MEQ tablet Take 20 mEq by mouth daily. 03/08/20   [provider]  Promethazine  HCl 6.25 MG/5ML SOLN SMARTSIG:1 Teaspoon By Mouth Every 4-6 Hours PRN 03/23/21   [provider]  QC LO-DOSE ASPIRIN  81 MG EC tablet Take 81 mg by mouth daily. 07/31/19   [provider]  risperiDONE  (RISPERDAL ) 3 MG tablet Take 3 mg by mouth 2 (two) times daily.      [provider]  SYSTANE COMPLETE PF 0.6 % SOLN Place 1 drop into both eyes 6 (six) times daily. 06/28/22   [provider]  tamsulosin  (FLOMAX ) 0.4 MG CAPS capsule Take 0.4 mg by mouth at bedtime. 07/24/21   [provider]  theophylline  (UNIPHYL) 400 MG 24 hr tablet Take 400 mg by mouth daily. 09/15/19   [provider]  Transparent Dressings (NEXCARE TEGADERM 4X4-3/4) MISC Apply topically as directed. 03/16/20   [provider]  traZODone  (DESYREL ) 50 MG tablet Take 50 mg by mouth at bedtime. 07/13/18   [provider]    Family History Family History  Problem Relation Age of Onset   Arthritis Other    Cancer Other    Diabetes Other    Hypertension Other    Gout Other     Social History Social History[1]   Allergies   Patient has no known allergies.   Review of Systems Review of Systems  Constitutional:  Negative for activity change, chills and fever.  HENT:  Positive for congestion, sinus pressure and sneezing. Negative for sore throat and trouble swallowing.   Respiratory:  Positive for cough. Negative for shortness of breath and wheezing.   Cardiovascular:  Negative for chest pain.  Gastrointestinal:  Negative for abdominal pain.  All other systems reviewed and  are negative.   Physical Exam Triage Vital Signs ED Triage Vitals  Encounter Vitals Group     BP 06/25/24 1007 (!) 146/91     Girls Systolic BP Percentile --      Girls Diastolic BP Percentile --      Boys Systolic BP Percentile --      Boys Diastolic BP Percentile --      Pulse Rate 06/25/24 1007 80     Resp 06/25/24 1007 18     Temp 06/25/24 1007 97.6 F (36.4 C)     Temp Source 06/25/24 1007 Oral     SpO2 06/25/24 1007 94 %     Weight --      Height --      Head Circumference --      Peak  Flow --      Pain Score 06/25/24 1005 0     Pain Loc --      Pain Education --      Exclude from Growth Chart --    No data found.  Updated Vital Signs BP (!) 146/91 (BP Location: Left Arm)   Pulse 80   Temp 97.6 F (36.4 C) (Oral)   Resp 18   SpO2 94%   Physical Exam Vitals reviewed.  Constitutional:      General: He is not in acute distress.    Appearance: Normal appearance. He is not toxic-appearing.  HENT:     Head: Normocephalic and atraumatic.     Right Ear: External ear normal.     Left Ear: External ear normal.     Nose: Congestion and rhinorrhea present.     Mouth/Throat:     Mouth: Mucous membranes are dry.     Pharynx: Oropharynx is clear. No oropharyngeal exudate or posterior oropharyngeal erythema.  Eyes:     Extraocular Movements: Extraocular movements intact.     Conjunctiva/sclera: Conjunctivae normal.     Pupils: Pupils are equal, round, and reactive to light.  Cardiovascular:     Rate and Rhythm: Normal rate and regular rhythm.  Pulmonary:     Effort: Pulmonary effort is normal.     Breath sounds: Normal breath sounds. No wheezing, rhonchi or rales.  Abdominal:     General: Abdomen is flat. Bowel sounds are normal.     Palpations: Abdomen is soft.     Tenderness: There is no abdominal tenderness.  Musculoskeletal:     Cervical back: Normal range of motion.  Lymphadenopathy:     Cervical: No cervical adenopathy.  Skin:    General: Skin is dry.      Capillary Refill: Capillary refill takes less than 2 seconds.     Comments: Hands are cold to touch, not significantly discolored. Capillary refill is brisk. Radial and ulnar pulses intact bilaterally.   Neurological:     Mental Status: He is alert.    UC Treatments / Results  Labs (all labs ordered are listed, but only abnormal results are displayed) Labs Reviewed  POC COVID19/FLU A&B COMBO   EKG  Radiology No results found.  Procedures Procedures (including critical care time)  Medications Ordered in UC Medications - No data to display  Initial Impression / Assessment and Plan / UC Course  I have reviewed the triage vital signs and the nursing notes.  Pertinent labs & imaging results that were available during my care of the patient were reviewed by me and considered in my medical decision making (see chart for details).    Patient is a 53 year old male with mild intellectual disability, CHF, HTN, and asthma presenting to urgent care with his caregiver endorsing a 2-3-day history of sinus congestion and dark yellow/green secretions.  He is afebrile.  Vitals are stable.  Congestion and rhinorrhea noted on exam.  He lives in a group home where a fellow resident has been sick.  COVID/influenza testing in urgent care today are negative.  Hands are cold to touch but not significantly discolored and appear to be adequately perfused.  Given changes to quality of nasal secretions, will empirically cover for bacterial rhinosinusitis.  I prescribed doxycycline  100 mg twice daily x 5 days.  The patient and his caregiver were instructed to return to care if symptoms worsen or fail to improve.  The patient and his caregiver are in agreement with this  plan.  He is medically stable for discharge at this time.  Final Clinical Impressions(s) / UC Diagnoses   Final diagnoses:  Nasal sinus congestion  Acute bacterial rhinosinusitis     Discharge Instructions      Will empirically treat  for bacterial sinus infection with doxycycline . Take 1 table twice daily for 5 days. Return to care if you experience fever or worsening / persistent symptoms.      ED Prescriptions     Medication Sig Dispense Auth. Provider   doxycycline  (VIBRAMYCIN ) 100 MG capsule Take 1 capsule (100 mg total) by mouth 2 (two) times daily for 5 days. 10 capsule Abijah Roussel E, MD      PDMP not reviewed this encounter.     [1]  Social History Tobacco Use   Smoking status: Former   Smokeless tobacco: Never  Vaping Use   Vaping status: Never Used  Substance Use Topics   Alcohol use: No   Drug use: No     Melvenia Manus BRAVO, MD 06/25/24 1112

## 2024-06-25 NOTE — ED Triage Notes (Signed)
 Caregiver reports patient having cough and congestion that started over the weekend. Caregiver noticed fingers were yellow this morning.

## 2024-06-25 NOTE — Discharge Instructions (Signed)
 Will empirically treat for bacterial sinus infection with doxycycline . Take 1 table twice daily for 5 days. Return to care if you experience fever or worsening / persistent symptoms.

## 2024-06-30 ENCOUNTER — Ambulatory Visit: Admitting: Podiatry

## 2024-07-23 ENCOUNTER — Encounter (HOSPITAL_BASED_OUTPATIENT_CLINIC_OR_DEPARTMENT_OTHER): Payer: Self-pay

## 2024-07-23 ENCOUNTER — Emergency Department (HOSPITAL_BASED_OUTPATIENT_CLINIC_OR_DEPARTMENT_OTHER)

## 2024-07-23 ENCOUNTER — Observation Stay (HOSPITAL_BASED_OUTPATIENT_CLINIC_OR_DEPARTMENT_OTHER)
Admission: EM | Admit: 2024-07-23 | Discharge: 2024-07-25 | Disposition: A | Discharge status: Home Health | Attending: Family Medicine | Admitting: Family Medicine

## 2024-07-23 ENCOUNTER — Other Ambulatory Visit: Payer: Self-pay

## 2024-07-23 DIAGNOSIS — I509 Heart failure, unspecified: Secondary | ICD-10-CM

## 2024-07-23 DIAGNOSIS — I252 Old myocardial infarction: Secondary | ICD-10-CM | POA: Insufficient documentation

## 2024-07-23 DIAGNOSIS — Z79899 Other long term (current) drug therapy: Secondary | ICD-10-CM | POA: Insufficient documentation

## 2024-07-23 DIAGNOSIS — K219 Gastro-esophageal reflux disease without esophagitis: Secondary | ICD-10-CM | POA: Insufficient documentation

## 2024-07-23 DIAGNOSIS — R296 Repeated falls: Secondary | ICD-10-CM | POA: Insufficient documentation

## 2024-07-23 DIAGNOSIS — F70 Mild intellectual disabilities: Secondary | ICD-10-CM | POA: Insufficient documentation

## 2024-07-23 DIAGNOSIS — E1165 Type 2 diabetes mellitus with hyperglycemia: Secondary | ICD-10-CM | POA: Diagnosis not present

## 2024-07-23 DIAGNOSIS — F6381 Intermittent explosive disorder: Secondary | ICD-10-CM | POA: Diagnosis not present

## 2024-07-23 DIAGNOSIS — N182 Chronic kidney disease, stage 2 (mild): Secondary | ICD-10-CM | POA: Insufficient documentation

## 2024-07-23 DIAGNOSIS — W19XXXA Unspecified fall, initial encounter: Secondary | ICD-10-CM | POA: Diagnosis not present

## 2024-07-23 DIAGNOSIS — T68XXXA Hypothermia, initial encounter: Secondary | ICD-10-CM | POA: Diagnosis not present

## 2024-07-23 DIAGNOSIS — M6281 Muscle weakness (generalized): Secondary | ICD-10-CM | POA: Insufficient documentation

## 2024-07-23 DIAGNOSIS — E119 Type 2 diabetes mellitus without complications: Secondary | ICD-10-CM

## 2024-07-23 DIAGNOSIS — Z7984 Long term (current) use of oral hypoglycemic drugs: Secondary | ICD-10-CM | POA: Insufficient documentation

## 2024-07-23 DIAGNOSIS — X31XXXA Exposure to excessive natural cold, initial encounter: Secondary | ICD-10-CM | POA: Insufficient documentation

## 2024-07-23 DIAGNOSIS — I10 Essential (primary) hypertension: Secondary | ICD-10-CM | POA: Diagnosis present

## 2024-07-23 DIAGNOSIS — I130 Hypertensive heart and chronic kidney disease with heart failure and stage 1 through stage 4 chronic kidney disease, or unspecified chronic kidney disease: Secondary | ICD-10-CM | POA: Diagnosis not present

## 2024-07-23 DIAGNOSIS — A419 Sepsis, unspecified organism: Secondary | ICD-10-CM | POA: Diagnosis not present

## 2024-07-23 DIAGNOSIS — I5032 Chronic diastolic (congestive) heart failure: Secondary | ICD-10-CM | POA: Diagnosis not present

## 2024-07-23 DIAGNOSIS — G9341 Metabolic encephalopathy: Secondary | ICD-10-CM | POA: Diagnosis not present

## 2024-07-23 DIAGNOSIS — R4182 Altered mental status, unspecified: Secondary | ICD-10-CM | POA: Diagnosis present

## 2024-07-23 DIAGNOSIS — E1122 Type 2 diabetes mellitus with diabetic chronic kidney disease: Secondary | ICD-10-CM | POA: Diagnosis not present

## 2024-07-23 DIAGNOSIS — Z87891 Personal history of nicotine dependence: Secondary | ICD-10-CM | POA: Diagnosis not present

## 2024-07-23 LAB — CBC WITH DIFFERENTIAL/PLATELET
Abs Immature Granulocytes: 0.11 K/uL — ABNORMAL HIGH (ref 0.00–0.07)
Basophils Absolute: 0.1 K/uL (ref 0.0–0.1)
Basophils Relative: 0 %
Eosinophils Absolute: 0.1 K/uL (ref 0.0–0.5)
Eosinophils Relative: 1 %
HCT: 43.1 % (ref 39.0–52.0)
Hemoglobin: 15.1 g/dL (ref 13.0–17.0)
Immature Granulocytes: 1 %
Lymphocytes Relative: 7 %
Lymphs Abs: 0.9 K/uL (ref 0.7–4.0)
MCH: 28.5 pg (ref 26.0–34.0)
MCHC: 35 g/dL (ref 30.0–36.0)
MCV: 81.3 fL (ref 80.0–100.0)
Monocytes Absolute: 0.8 K/uL (ref 0.1–1.0)
Monocytes Relative: 6 %
Neutro Abs: 11.7 K/uL — ABNORMAL HIGH (ref 1.7–7.7)
Neutrophils Relative %: 85 %
Platelets: 243 K/uL (ref 150–400)
RBC: 5.3 MIL/uL (ref 4.22–5.81)
RDW: 12.2 % (ref 11.5–15.5)
WBC: 13.6 K/uL — ABNORMAL HIGH (ref 4.0–10.5)
nRBC: 0 % (ref 0.0–0.2)

## 2024-07-23 LAB — URINALYSIS, W/ REFLEX TO CULTURE (INFECTION SUSPECTED)
Bacteria, UA: NONE SEEN
Bilirubin Urine: NEGATIVE
Glucose, UA: 500 mg/dL — AB
Ketones, ur: NEGATIVE mg/dL
Leukocytes,Ua: NEGATIVE
Nitrite: NEGATIVE
Protein, ur: NEGATIVE mg/dL
Specific Gravity, Urine: 1.013 (ref 1.005–1.030)
pH: 5.5 (ref 5.0–8.0)

## 2024-07-23 LAB — COMPREHENSIVE METABOLIC PANEL WITH GFR
ALT: 31 U/L (ref 0–44)
AST: 29 U/L (ref 15–41)
Albumin: 4 g/dL (ref 3.5–5.0)
Alkaline Phosphatase: 159 U/L — ABNORMAL HIGH (ref 38–126)
Anion gap: 11 (ref 5–15)
BUN: 29 mg/dL — ABNORMAL HIGH (ref 6–20)
CO2: 30 mmol/L (ref 22–32)
Calcium: 10.3 mg/dL (ref 8.9–10.3)
Chloride: 95 mmol/L — ABNORMAL LOW (ref 98–111)
Creatinine, Ser: 1.66 mg/dL — ABNORMAL HIGH (ref 0.61–1.24)
GFR, Estimated: 49 mL/min — ABNORMAL LOW
Glucose, Bld: 365 mg/dL — ABNORMAL HIGH (ref 70–99)
Potassium: 4.7 mmol/L (ref 3.5–5.1)
Sodium: 136 mmol/L (ref 135–145)
Total Bilirubin: 0.4 mg/dL (ref 0.0–1.2)
Total Protein: 7.7 g/dL (ref 6.5–8.1)

## 2024-07-23 LAB — CORTISOL: Cortisol, Plasma: 13.7 ug/dL

## 2024-07-23 LAB — TROPONIN T, HIGH SENSITIVITY
Troponin T High Sensitivity: 93 ng/L — ABNORMAL HIGH (ref 0–19)
Troponin T High Sensitivity: 88 ng/L — ABNORMAL HIGH (ref 0–19)

## 2024-07-23 LAB — I-STAT VENOUS BLOOD GAS, ED
Acid-Base Excess: 7 mmol/L — ABNORMAL HIGH (ref 0.0–2.0)
Bicarbonate: 32 mmol/L — ABNORMAL HIGH (ref 20.0–28.0)
Calcium, Ion: 1.23 mmol/L (ref 1.15–1.40)
HCT: 38 % — ABNORMAL LOW (ref 39.0–52.0)
Hemoglobin: 12.9 g/dL — ABNORMAL LOW (ref 13.0–17.0)
O2 Saturation: 94 %
Patient temperature: 96
Potassium: 4.2 mmol/L (ref 3.5–5.1)
Sodium: 138 mmol/L (ref 135–145)
TCO2: 33 mmol/L — ABNORMAL HIGH (ref 22–32)
pCO2, Ven: 45.2 mmHg (ref 44–60)
pH, Ven: 7.453 — ABNORMAL HIGH (ref 7.25–7.43)
pO2, Ven: 62 mmHg — ABNORMAL HIGH (ref 32–45)

## 2024-07-23 LAB — TSH: TSH: 1.71 u[IU]/mL (ref 0.350–4.500)

## 2024-07-23 LAB — PROTIME-INR
INR: 1.1 (ref 0.8–1.2)
Prothrombin Time: 14.3 s (ref 11.4–15.2)

## 2024-07-23 LAB — DIGOXIN LEVEL: Digoxin Level: 1.8 ng/mL (ref 0.8–2.0)

## 2024-07-23 LAB — RESP PANEL BY RT-PCR (RSV, FLU A&B, COVID)  RVPGX2
Influenza A by PCR: NEGATIVE
Influenza B by PCR: NEGATIVE
Resp Syncytial Virus by PCR: NEGATIVE
SARS Coronavirus 2 by RT PCR: NEGATIVE

## 2024-07-23 LAB — LACTIC ACID, PLASMA
Lactic Acid, Venous: 2.4 mmol/L (ref 0.5–1.9)
Lactic Acid, Venous: 1.1 mmol/L (ref 0.5–1.9)

## 2024-07-23 LAB — MAGNESIUM: Magnesium: 2 mg/dL (ref 1.7–2.4)

## 2024-07-23 LAB — LIPASE, BLOOD: Lipase: 35 U/L (ref 11–51)

## 2024-07-23 LAB — PHOSPHORUS: Phosphorus: 4.4 mg/dL (ref 2.5–4.6)

## 2024-07-23 MED ORDER — LACTATED RINGERS IV SOLN: INTRAVENOUS | Status: AC

## 2024-07-23 MED ORDER — VANCOMYCIN HCL IN DEXTROSE 1-5 GM/200ML-% IV SOLN
1000.0000 mg | Freq: Once | INTRAVENOUS | Status: AC
  Administered 2024-07-23: 1000 mg via INTRAVENOUS
  Filled 2024-07-23: qty 200

## 2024-07-23 MED ORDER — SODIUM CHLORIDE 0.9 % IV SOLN: 1.0000 g | Freq: Three times a day (TID) | INTRAVENOUS | Status: DC

## 2024-07-23 MED ORDER — SODIUM CHLORIDE 0.9 % IV SOLN
2.0000 g | Freq: Once | INTRAVENOUS | Status: AC
  Administered 2024-07-23: 2 g via INTRAVENOUS
  Filled 2024-07-23: qty 12.5

## 2024-07-23 NOTE — ED Provider Notes (Addendum)
 " [place] EMERGENCY DEPARTMENT AT [facility] Provider Note   CSN: [id number] Arrival date & time: 07/23/24  9041     Patient presents with: Altered Mental Status and Fall   Ryan Case is a 54 y.o. male.   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  Patient's current caregiver has been with him for over 10 years.  He is very familiar with the patient.  Caregiver reports that over the past week the patient has had a change in functional capacity.  He has experienced frequent falls over about the past 2 weeks.  Patient also has had a decline in cognitive function that is fairly acute over the past week.  He reports that at baseline the patient communicates verbally albeit sometimes needs repetition and can be somewhat difficult to understand but over the past week, speech has become less intelligible and seems confused.  Patient has not had any fevers.  He has not had vomiting or diarrhea but very atypical for him, just as of last night he had a stool on the floor.  An image was taken and there was 1 large amount of formed hard stool and then a lot of liquidy stool.  Patient's caregiver reports that is not unusual for the patient have incontinence of urine and sometimes this is behavioral but he does not have stool incontinence or behavioral issues with fecal deposition.  Notably last week the patient's caregiver observed that the patient's fingers went extremely white like all of the circulation was out of them.  He reports he then took the patient to urgent care last week and he was empirically placed on antibiotics but no specific etiology identified.  Patient was also seen by his PCP 1\8\26.  Apparently this was more for routine follow-up.  Patient's caregiver notes that mostly symptoms seem to started since that time.    Prior to Admission medications  Medication Sig Start Date End Date  Taking? Authorizing Provider  ACCU-CHEK AVIVA PLUS test strip check blood sugar TWICE DAILY 10/21/21   [provider]  albuterol  (VENTOLIN  HFA) 108 (90 Base) MCG/ACT inhaler Inhale 2 puffs into the lungs every 4 (four) hours as needed. 03/08/20   [provider]  amantadine  (SYMMETREL ) 100 MG capsule Take 100 mg by mouth every morning. 06/29/22   [provider]  amitriptyline  (ELAVIL ) 25 MG tablet Take 25 mg by mouth at bedtime.      [provider]  amitriptyline  (ELAVIL ) 50 MG tablet Take 50 mg by mouth at bedtime. 06/03/24   [provider]  carvedilol  (COREG ) 3.125 MG tablet Take 3.125 mg by mouth 2 (two) times daily with a meal.      [provider]  desmopressin  (DDAVP ) 0.2 MG tablet TAKE TWO TABLETS EVERY DAY Patient taking differently: Take 0.4 mg by mouth at bedtime. 03/24/21   McKenzie, Belvie LITTIE, MD  digoxin  (LANOXIN ) 0.25 MG tablet Take 250 mcg by mouth daily.      [provider]  famotidine  (PEPCID ) 20 MG tablet Take 20 mg by mouth at bedtime.    [provider]  furosemide (LASIX) 40 MG tablet Take 40 mg by mouth daily. 04/22/24   [provider]  gabapentin  (NEURONTIN ) 400 MG capsule Take 1,200 mg by mouth at bedtime. Takes all three capsules at bedtime.    [provider]  glipiZIDE -metformin  (METAGLIP ) 5-500 MG tablet Take 1 tablet by  mouth daily. 07/31/19   [provider]  hydroxypropyl methylcellulose / hypromellose (ISOPTO TEARS / GONIOVISC) 2.5 % ophthalmic solution Place 1 drop into the left eye 4 (four) times daily as needed for dry eyes. 04/18/23   Currieo, Andrew D, MD  LORazepam  (ATIVAN ) 1 MG tablet Take 1 mg by mouth at bedtime.    [provider]  neomycin-polymyxin-pramoxine (NEOSPORIN  PLUS) 1 % cream Apply to left foot lacerations once daily until healed. 03/31/20   Gaynel Delon CROME, DPM  oseltamivir  (TAMIFLU ) 30 MG capsule Take 1 capsule (30 mg total) by mouth 2  (two) times daily. Take 1 capsule tonight, then one capsule twice daily for two more days. 07/17/22   Karna Fellows, MD  potassium chloride  (KLOR-CON ) 10 MEQ tablet Take 20 mEq by mouth daily. 03/08/20   [provider]  Promethazine  HCl 6.25 MG/5ML SOLN SMARTSIG:1 Teaspoon By Mouth Every 4-6 Hours PRN 03/23/21   [provider]  QC LO-DOSE ASPIRIN  81 MG EC tablet Take 81 mg by mouth daily. 07/31/19   [provider]  risperiDONE  (RISPERDAL ) 3 MG tablet Take 3 mg by mouth 2 (two) times daily.      [provider]  SYSTANE COMPLETE PF 0.6 % SOLN Place 1 drop into both eyes 6 (six) times daily. 06/28/22   [provider]  tamsulosin  (FLOMAX ) 0.4 MG CAPS capsule Take 0.4 mg by mouth at bedtime. 07/24/21   [provider]  theophylline  (THEODUR) 450 MG 12 hr tablet Take 450 mg by mouth daily. 06/18/24   [provider]  theophylline  (UNIPHYL) 400 MG 24 hr tablet Take 400 mg by mouth daily. 09/15/19   [provider]  Transparent Dressings (NEXCARE TEGADERM 4X4-3/4) MISC Apply topically as directed. 03/16/20   [provider]  traZODone  (DESYREL ) 50 MG tablet Take 50 mg by mouth at bedtime. 07/13/18   [provider]    Allergies: Patient has no known allergies.    Review of Systems  Updated Vital Signs BP 134/80   Pulse 98   Temp (!) 95.8 F (35.4 C)   Resp 19   SpO2 99%   Physical Exam Constitutional:      Comments: Patient is alert.  He does not exhibit any respiratory distress.  He does have eye contact and interaction.  He does appear cognitively delayed but appropriately responsive to direct interactions.  HENT:     Head:     Comments: Patient has superficial abrasion to his forehead and his nose.  No large hematoma.    Mouth/Throat:     Mouth: Mucous membranes are dry.     Pharynx: Oropharynx is clear.  Cardiovascular:     Rate and Rhythm: Normal rate and regular rhythm.     Comments: Bilateral femoral  pulses 2+ and strong. Pulmonary:     Effort: Pulmonary effort is normal.     Breath sounds: Normal breath sounds.  Abdominal:     Comments: Abdomen is soft.  Abdomen is not distended.  Patient does not exhibit pain to palpation or object to abdominal examination.  Genitourinary:    Comments: Normal appearance of penis and scrotum without any significant swelling or prominent rash. Musculoskeletal:     Cervical back: Neck supple.     Comments: Generally, patient is fairly thin borderline cachectic.  He has muscular atrophy.  No deformities of the extremities.  No peripheral edema.  At this time he has brisk cap refill of the fingers.  Both feet are cool  but perfused.  Color is normal.  No pallor or cyanosis of the digits.  Skin:    General: Skin is warm and dry.  Neurological:     Comments: Patient is awake with alert appearance.  He is attentive to interactions.  He does appear cognitively delayed.  Speech is difficult to understand.  No focal motor deficits.     (all labs ordered are listed, but only abnormal results are displayed) Labs Reviewed  CBC WITH DIFFERENTIAL/PLATELET - Abnormal; Notable for the following components:      Result Value   WBC 13.6 (*)    Neutro Abs 11.7 (*)    Abs Immature Granulocytes 0.11 (*)    All other components within normal limits  COMPREHENSIVE METABOLIC PANEL WITH GFR - Abnormal; Notable for the following components:   Chloride 95 (*)    Glucose, Bld 365 (*)    BUN 29 (*)    Creatinine, Ser 1.66 (*)    Alkaline Phosphatase 159 (*)    GFR, Estimated 49 (*)    All other components within normal limits  LACTIC ACID, PLASMA - Abnormal; Notable for the following components:   Lactic Acid, Venous 2.4 (*)    All other components within normal limits  URINALYSIS, W/ REFLEX TO CULTURE (INFECTION SUSPECTED) - Abnormal; Notable for the following components:   Glucose, UA 500 (*)    Hgb urine dipstick TRACE (*)    All other components within normal  limits  I-STAT VENOUS BLOOD GAS, ED - Abnormal; Notable for the following components:   pH, Ven 7.453 (*)    pO2, Ven 62 (*)    Bicarbonate 32.0 (*)    TCO2 33 (*)    Acid-Base Excess 7.0 (*)    HCT 38.0 (*)    Hemoglobin 12.9 (*)    All other components within normal limits  TROPONIN T, HIGH SENSITIVITY - Abnormal; Notable for the following components:   Troponin T High Sensitivity 93 (*)    All other components within normal limits  RESP PANEL BY RT-PCR (RSV, FLU A&B, COVID)  RVPGX2  CULTURE, BLOOD (ROUTINE X 2)  CULTURE, BLOOD (ROUTINE X 2)  LACTIC ACID, PLASMA  PROTIME-INR  TSH  LIPASE, BLOOD  MAGNESIUM  PHOSPHORUS  TROPONIN T, HIGH SENSITIVITY    EKG: EKG Interpretation Date/Time:  Wednesday July 23 2024 11:44:40 EST Ventricular Rate:  81 PR Interval:  165 QRS Duration:  108 QT Interval:  386 QTC Calculation: 448 R Axis:   52  Text Interpretation: Sinus rhythm Probable left atrial enlargement LVH with secondary repolarization abnormality Probable anterior infarct, age indeterminate similar to previous tracing Confirmed by Armenta Canning [phone]) on 07/23/2024 2:38:11 PM  Radiology: [facility] Chest Port 1 View Result Date: 07/23/2024 EXAM: 1 VIEW(S) XRAY OF THE CHEST 07/23/2024 11:39:25 AM COMPARISON: 07/16/2022 CLINICAL HISTORY: Sepsis evaluation FINDINGS: LUNGS AND PLEURA: No focal pulmonary opacity. No pleural effusion. No pneumothorax. HEART AND MEDIASTINUM: No acute abnormality of the cardiac and mediastinal silhouettes. BONES AND SOFT TISSUES: No acute osseous abnormality. IMPRESSION: 1. No acute cardiopulmonary abnormality. Electronically signed by: Waddell Calk MD 07/23/2024 12:37 PM EST RP Workstation: [id number]   CT Head Wo Contrast Result Date: 07/23/2024 EXAM: CT HEAD WITHOUT CONTRAST 07/23/2024 11:26:58 AM TECHNIQUE: CT of the head was performed without the administration of intravenous contrast. Automated exposure control, iterative reconstruction, and/or  weight based adjustment of the mA/kV was utilized to reduce the radiation dose to as low as reasonably achievable. COMPARISON: 07/15/2022 CLINICAL HISTORY: The patient  presents with a mental status change of unknown cause. FINDINGS: BRAIN AND VENTRICLES: No acute hemorrhage. No evidence of acute infarct. No hydrocephalus. No extra-axial collection. No mass effect or midline shift. Chronic mineralization in basal ganglia and left cerebellum. ORBITS: No acute abnormality. SINUSES: No acute abnormality. SOFT TISSUES AND SKULL: No acute soft tissue abnormality. No skull fracture. There is underpneumatization of the mastoid temporal bones bilaterally. Chronic appearing bilateral nasal bone deformities. IMPRESSION: 1. No acute intracranial abnormality. Electronically signed by: Donnice Mania MD 07/23/2024 11:54 AM EST RP Workstation: [id number]     Procedures   Medications Ordered in the ED  lactated ringers  infusion ( Intravenous New Bag/Given 07/23/24 1146)  ceFEPIme  (MAXIPIME ) 1 g in sodium chloride  0.9 % 100 mL IVPB (has no administration in time range)                                    Medical Decision Making Amount and/or Complexity of Data Reviewed Labs: ordered. Radiology: ordered.  Risk Prescription drug management. Decision regarding hospitalization.   Patient presents as outlined.  He has had generalized symptoms of weakness and falls and some worsening of baseline cognitive function.  On arrival patient is hypothermic 95.6.  Will proceed with septic workup.  Patient's mental status is awake and pleasantly interactive.  He does not exhibit respiratory distress.  He is not hypotensive.  Urinalysis negative respiratory panel negative initial lactic acid 2.4.  White count 13.6 H&H normal.  GFR 49  Chest x-ray inter by radiology no acute findings.  CT head read by radiology no acute findings.  Patient required urine specimen for sepsis workup.  Foley temperature cath placed.  Patient  is in the [employer]  Suspected sepsis.  No clear source.  It sounds as though the patient has been having some general decline for about a week.  At this time in the setting of hypothermia, leukocytosis and initial elevation lactic acid will proceed with broad coverage antibiotics and admission.  Patient does not require aggressive fluid resuscitation.  Blood pressures are normotensive and heart rate is less than 100.  Consult: Dr. Gokul for admission.   Final diagnoses:  Sepsis, due to unspecified organism, unspecified whether acute organ dysfunction present [facility])  Hypothermia, initial encounter    ED Discharge Orders     None          Armenta Canning, MD 07/23/24 1448    Armenta Canning, MD 07/23/24 1506  "

## 2024-07-23 NOTE — ED Triage Notes (Signed)
 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 Denies thinners. Also endorses slurred speech that began last week. Seen at [facility] and PCP for same.

## 2024-07-23 NOTE — Progress Notes (Signed)
 ED Pharmacy Antibiotic Sign Off An antibiotic consult was received from an ED provider for vancomycin  per pharmacy dosing for sepsis. A chart review was completed to assess appropriateness.   The following one time order(s) were placed:  Vancomycin  1g IV x 1  Further antibiotic and/or antibiotic pharmacy consults should be ordered by the admitting provider if indicated.   Thank you for allowing pharmacy to be a part of this patient's care.   Roshanda Balazs, Vernell Helling, [facility]  Clinical Pharmacist 07/23/2024 2:46 PM

## 2024-07-23 NOTE — ED Notes (Signed)
 Core body tempeature is back to normal, removed the bair hugger, but has a blanket on currently

## 2024-07-23 NOTE — ED Notes (Addendum)
Patient placed on Bair Hugger. 

## 2024-07-24 ENCOUNTER — Observation Stay (HOSPITAL_COMMUNITY)

## 2024-07-24 DIAGNOSIS — E1122 Type 2 diabetes mellitus with diabetic chronic kidney disease: Secondary | ICD-10-CM | POA: Diagnosis not present

## 2024-07-24 DIAGNOSIS — R739 Hyperglycemia, unspecified: Secondary | ICD-10-CM | POA: Diagnosis not present

## 2024-07-24 DIAGNOSIS — R4182 Altered mental status, unspecified: Secondary | ICD-10-CM | POA: Diagnosis not present

## 2024-07-24 DIAGNOSIS — G9341 Metabolic encephalopathy: Secondary | ICD-10-CM | POA: Diagnosis not present

## 2024-07-24 DIAGNOSIS — K219 Gastro-esophageal reflux disease without esophagitis: Secondary | ICD-10-CM | POA: Diagnosis not present

## 2024-07-24 DIAGNOSIS — T68XXXD Hypothermia, subsequent encounter: Secondary | ICD-10-CM | POA: Diagnosis not present

## 2024-07-24 DIAGNOSIS — I129 Hypertensive chronic kidney disease with stage 1 through stage 4 chronic kidney disease, or unspecified chronic kidney disease: Secondary | ICD-10-CM | POA: Diagnosis not present

## 2024-07-24 DIAGNOSIS — T68XXXA Hypothermia, initial encounter: Secondary | ICD-10-CM | POA: Diagnosis present

## 2024-07-24 DIAGNOSIS — I10 Essential (primary) hypertension: Secondary | ICD-10-CM | POA: Diagnosis not present

## 2024-07-24 DIAGNOSIS — N182 Chronic kidney disease, stage 2 (mild): Secondary | ICD-10-CM | POA: Diagnosis not present

## 2024-07-24 DIAGNOSIS — A419 Sepsis, unspecified organism: Secondary | ICD-10-CM | POA: Diagnosis present

## 2024-07-24 DIAGNOSIS — Z743 Need for continuous supervision: Secondary | ICD-10-CM | POA: Diagnosis not present

## 2024-07-24 LAB — AMMONIA: Ammonia: <13 umol/L (ref 9–35)

## 2024-07-24 LAB — GLUCOSE, CAPILLARY
Glucose-Capillary: 572 mg/dL (ref 70–99)
Glucose-Capillary: 273 mg/dL — ABNORMAL HIGH (ref 70–99)
Glucose-Capillary: 89 mg/dL (ref 70–99)
Glucose-Capillary: 151 mg/dL — ABNORMAL HIGH (ref 70–99)

## 2024-07-24 LAB — CBC WITH DIFFERENTIAL/PLATELET
Abs Immature Granulocytes: 0.04 K/uL (ref 0.00–0.07)
Basophils Absolute: 0 K/uL (ref 0.0–0.1)
Basophils Relative: 0 %
Eosinophils Absolute: 0.1 K/uL (ref 0.0–0.5)
Eosinophils Relative: 1 %
HCT: 37.2 % — ABNORMAL LOW (ref 39.0–52.0)
Hemoglobin: 13.3 g/dL (ref 13.0–17.0)
Immature Granulocytes: 1 %
Lymphocytes Relative: 10 %
Lymphs Abs: 0.9 K/uL (ref 0.7–4.0)
MCH: 28.5 pg (ref 26.0–34.0)
MCHC: 35.8 g/dL (ref 30.0–36.0)
MCV: 79.8 fL — ABNORMAL LOW (ref 80.0–100.0)
Monocytes Absolute: 0.7 K/uL (ref 0.1–1.0)
Monocytes Relative: 8 %
Neutro Abs: 6.9 K/uL (ref 1.7–7.7)
Neutrophils Relative %: 80 %
Platelets: 202 K/uL (ref 150–400)
RBC: 4.66 MIL/uL (ref 4.22–5.81)
RDW: 11.9 % (ref 11.5–15.5)
WBC: 8.7 K/uL (ref 4.0–10.5)
nRBC: 0 % (ref 0.0–0.2)

## 2024-07-24 LAB — BLOOD CULTURE ID PANEL (REFLEXED) - BCID2

## 2024-07-24 LAB — BASIC METABOLIC PANEL WITH GFR
Anion gap: 9 (ref 5–15)
BUN: 23 mg/dL — ABNORMAL HIGH (ref 6–20)
CO2: 31 mmol/L (ref 22–32)
Calcium: 9.3 mg/dL (ref 8.9–10.3)
Chloride: 95 mmol/L — ABNORMAL LOW (ref 98–111)
Creatinine, Ser: 1.54 mg/dL — ABNORMAL HIGH (ref 0.61–1.24)
GFR, Estimated: 53 mL/min — ABNORMAL LOW
Glucose, Bld: 457 mg/dL — ABNORMAL HIGH (ref 70–99)
Potassium: 4.2 mmol/L (ref 3.5–5.1)
Sodium: 136 mmol/L (ref 135–145)

## 2024-07-24 LAB — HIV ANTIBODY (ROUTINE TESTING W REFLEX): HIV Screen 4th Generation wRfx: NONREACTIVE

## 2024-07-24 LAB — HEMOGLOBIN A1C
Hgb A1c MFr Bld: 11.4 % — ABNORMAL HIGH (ref 4.8–5.6)
Mean Plasma Glucose: 280.48 mg/dL

## 2024-07-24 LAB — CBG MONITORING, ED: Glucose-Capillary: 469 mg/dL — ABNORMAL HIGH (ref 70–99)

## 2024-07-24 LAB — THEOPHYLLINE LEVEL: Theophylline Lvl: 13.8 ug/mL (ref 10.0–20.0)

## 2024-07-24 MED ORDER — CARVEDILOL 3.125 MG PO TABS
3.1250 mg | ORAL_TABLET | Freq: Two times a day (BID) | ORAL | Status: DC
  Administered 2024-07-24 – 2024-07-25 (×2): 3.125 mg via ORAL
  Filled 2024-07-24 (×2): qty 1

## 2024-07-24 MED ORDER — TAMSULOSIN HCL 0.4 MG PO CAPS
0.4000 mg | ORAL_CAPSULE | Freq: Every day | ORAL | Status: DC
  Administered 2024-07-24: 0.4 mg via ORAL
  Filled 2024-07-24: qty 1

## 2024-07-24 MED ORDER — THEOPHYLLINE ER 400 MG PO TB24
400.0000 mg | ORAL_TABLET | Freq: Every day | ORAL | Status: DC
  Administered 2024-07-24 – 2024-07-25 (×2): 400 mg via ORAL
  Filled 2024-07-24 (×2): qty 1

## 2024-07-24 MED ORDER — ONDANSETRON HCL 4 MG PO TABS: 4.0000 mg | ORAL_TABLET | Freq: Four times a day (QID) | ORAL | Status: DC | PRN

## 2024-07-24 MED ORDER — ACETAMINOPHEN 650 MG RE SUPP: 650.0000 mg | Freq: Four times a day (QID) | RECTAL | Status: DC | PRN

## 2024-07-24 MED ORDER — ENOXAPARIN SODIUM 30 MG/0.3ML IJ SOSY
30.0000 mg | PREFILLED_SYRINGE | INTRAMUSCULAR | Status: DC
  Administered 2024-07-24 – 2024-07-25 (×2): 30 mg via SUBCUTANEOUS
  Filled 2024-07-24 (×2): qty 0.3

## 2024-07-24 MED ORDER — FAMOTIDINE 20 MG PO TABS
20.0000 mg | ORAL_TABLET | Freq: Every day | ORAL | Status: DC
  Administered 2024-07-24: 20 mg via ORAL
  Filled 2024-07-24: qty 1

## 2024-07-24 MED ORDER — ALBUTEROL SULFATE (2.5 MG/3ML) 0.083% IN NEBU: 3.0000 mL | INHALATION_SOLUTION | RESPIRATORY_TRACT | Status: DC | PRN

## 2024-07-24 MED ORDER — GABAPENTIN 400 MG PO CAPS
1200.0000 mg | ORAL_CAPSULE | Freq: Every day | ORAL | Status: DC
  Administered 2024-07-24: 1200 mg via ORAL
  Filled 2024-07-24: qty 3

## 2024-07-24 MED ORDER — SODIUM CHLORIDE 0.9 % IV BOLUS
1000.0000 mL | Freq: Once | INTRAVENOUS | Status: AC
  Administered 2024-07-24: 1000 mL via INTRAVENOUS

## 2024-07-24 MED ORDER — AMITRIPTYLINE HCL 25 MG PO TABS
25.0000 mg | ORAL_TABLET | Freq: Every day | ORAL | Status: DC
  Administered 2024-07-24: 25 mg via ORAL
  Filled 2024-07-24 (×2): qty 1

## 2024-07-24 MED ORDER — DIGOXIN 250 MCG PO TABS
250.0000 ug | ORAL_TABLET | Freq: Every day | ORAL | Status: DC
  Administered 2024-07-24 – 2024-07-25 (×2): 250 ug via ORAL
  Filled 2024-07-24 (×2): qty 1

## 2024-07-24 MED ORDER — LORAZEPAM 1 MG PO TABS
1.0000 mg | ORAL_TABLET | Freq: Every day | ORAL | Status: DC
  Administered 2024-07-24: 1 mg via ORAL
  Filled 2024-07-24: qty 1

## 2024-07-24 MED ORDER — RISPERIDONE 3 MG PO TABS
3.0000 mg | ORAL_TABLET | Freq: Two times a day (BID) | ORAL | Status: DC
  Administered 2024-07-24 – 2024-07-25 (×3): 3 mg via ORAL
  Filled 2024-07-24 (×4): qty 1

## 2024-07-24 MED ORDER — ONDANSETRON HCL 4 MG/2ML IJ SOLN: 4.0000 mg | Freq: Four times a day (QID) | INTRAMUSCULAR | Status: DC | PRN

## 2024-07-24 MED ORDER — INSULIN ASPART 100 UNIT/ML IJ SOLN
0.0000 [IU] | Freq: Three times a day (TID) | INTRAMUSCULAR | Status: DC
  Administered 2024-07-24 – 2024-07-25 (×2): 5 [IU] via SUBCUTANEOUS
  Administered 2024-07-25: 1 [IU] via SUBCUTANEOUS
  Filled 2024-07-24: qty 5
  Filled 2024-07-24: qty 1
  Filled 2024-07-24: qty 5

## 2024-07-24 MED ORDER — DESMOPRESSIN ACETATE 0.1 MG PO TABS
0.4000 mg | ORAL_TABLET | Freq: Every day | ORAL | Status: DC
  Administered 2024-07-24: 0.4 mg via ORAL
  Filled 2024-07-24 (×2): qty 4

## 2024-07-24 MED ORDER — INSULIN GLARGINE-YFGN 100 UNIT/ML ~~LOC~~ SOLN
10.0000 [IU] | SUBCUTANEOUS | Status: DC
  Administered 2024-07-24 – 2024-07-25 (×2): 10 [IU] via SUBCUTANEOUS
  Filled 2024-07-24 (×2): qty 0.1

## 2024-07-24 MED ORDER — AMANTADINE HCL 100 MG PO CAPS
100.0000 mg | ORAL_CAPSULE | Freq: Every morning | ORAL | Status: DC
  Administered 2024-07-24 – 2024-07-25 (×2): 100 mg via ORAL
  Filled 2024-07-24 (×2): qty 1

## 2024-07-24 MED ORDER — BISACODYL 5 MG PO TBEC: 5.0000 mg | DELAYED_RELEASE_TABLET | Freq: Every day | ORAL | Status: DC | PRN

## 2024-07-24 MED ORDER — TRAZODONE HCL 50 MG PO TABS
50.0000 mg | ORAL_TABLET | Freq: Every day | ORAL | Status: DC
  Administered 2024-07-24: 50 mg via ORAL
  Filled 2024-07-24: qty 1

## 2024-07-24 MED ORDER — INSULIN ASPART 100 UNIT/ML IJ SOLN
9.0000 [IU] | INTRAMUSCULAR | Status: AC
  Administered 2024-07-24: 9 [IU] via SUBCUTANEOUS
  Filled 2024-07-24: qty 9

## 2024-07-24 MED ORDER — LORAZEPAM 2 MG/ML IJ SOLN
1.0000 mg | Freq: Once | INTRAMUSCULAR | Status: AC
  Administered 2024-07-24: 2 mg via INTRAVENOUS
  Filled 2024-07-24: qty 1

## 2024-07-24 MED ORDER — HYPROMELLOSE (GONIOSCOPIC) 2.5 % OP SOLN: 1.0000 [drp] | Freq: Four times a day (QID) | OPHTHALMIC | Status: DC | PRN

## 2024-07-24 MED ORDER — GADOBUTROL 1 MMOL/ML IV SOLN
6.0000 mL | Freq: Once | INTRAVENOUS | Status: AC | PRN
  Administered 2024-07-24: 6 mL via INTRAVENOUS

## 2024-07-24 MED ORDER — ACETAMINOPHEN 325 MG PO TABS: 650.0000 mg | ORAL_TABLET | Freq: Four times a day (QID) | ORAL | Status: DC | PRN

## 2024-07-24 MED ORDER — POLYVINYL ALCOHOL 1.4 % OP SOLN
1.0000 [drp] | Freq: Every day | OPHTHALMIC | Status: DC
  Administered 2024-07-24 – 2024-07-25 (×5): 1 [drp] via OPHTHALMIC
  Filled 2024-07-24 (×2): qty 15

## 2024-07-24 NOTE — ED Notes (Signed)
 EDP notified of blood sugar. Verbal order for saline bolus given by EDP

## 2024-07-24 NOTE — ED Notes (Signed)
 Patient got 2 bowls of oatmeal for breakfast and 8oz of water

## 2024-07-24 NOTE — ED Notes (Signed)
 Pt requesting more oatmeal. Pt given more oatmeal and water at this time

## 2024-07-24 NOTE — Inpatient Diabetes Management (Signed)
 Inpatient Diabetes Program Recommendations  AACE/ADA: New Consensus Statement on Inpatient Glycemic Control (2015)  Target Ranges:  Prepandial:   less than 140 mg/dL      Peak postprandial:   less than 180 mg/dL (1-2 hours)      Critically ill patients:  140 - 180 mg/dL   Lab Results  Component Value Date   GLUCAP 572 (HH) 07/24/2024   HGBA1C 7.0 (H) 07/15/2022    Review of Glycemic Control  Latest Reference Range & Units 07/24/24 08:11 07/24/24 09:14  Glucose-Capillary 70 - 99 mg/dL 530 (H) 427 (HH)  (HH): Data is critically high (H): Data is abnormally high  Diabetes history: DM2 Outpatient Diabetes medications: Metaglip  5-500 mg BID Current orders for Inpatient glycemic control: Novolog  0-9 units TID  Current A1C is pending  Inpatient Diabetes Program Recommendations:    Semglee  10 units every day (0.15 units/kg) Novolog  0-15 units Q4H until CBGs <200 mg/dL then Novolog  0-15 units TID and 0-5 units at bedtime  Thank you, Wyvonna Pinal, MSN, CDCES Diabetes Coordinator Inpatient Diabetes Program [phone] (team pager from 8a-5p)

## 2024-07-24 NOTE — Plan of Care (Signed)

## 2024-07-24 NOTE — ED Notes (Signed)
 Called 4NP nurse to give update on care and update on blood sugar at this time.

## 2024-07-24 NOTE — Discharge Instructions (Addendum)
 Ryan Case,  You were in the hospital because of some confusion and falls and found to have a low body temperature. It is not clear what caused this but your workup for infection has been negative (although you had a positive blood culture, this appears to be related to a likely sample contamination). Please follow-up with your PCP. It may be reasonable to consider a neurology follow-up, but I will defer this decision to you and your PCP.

## 2024-07-24 NOTE — ED Notes (Signed)
 Called Report to the RN taking patient [id number]

## 2024-07-24 NOTE — H&P (Addendum)
 " History and Physical    Patient: Ryan Case FMW:[id number] DOB: 02/12/1971 DOA: 07/23/2024 DOS: the patient was seen and examined on 07/24/2024 PCP: Ilah Crigler, MD  Patient coming from: group home  Chief Complaint:  Chief Complaint  Patient presents with   Altered Mental Status   Fall   HPI: Ryan Case is a 54 y.o. male with medical history significant of type 2 DM, hypertension, CHF, GERD, cognitive impairment presented to [facility] for 2 to 3 weeks of recurrent falls, increased cognitive impairment, confusion, questionable slurring of speech. Patient is unable to give me appropriate history, though he is alert and conversing. He is fixated on losing 1 lb of weight and wants breakfast. Most of the history obtained from the EDP note and mom over the phone.  There are no complaints of fever, or chills, nausea, vomiting or diarrhea. As per the mom he has a change in mental status gradually over a period of 2 to 3 weeks.  He was seen by PCP on 07/17/24 and given tamiflu  and antibiotics for a questionable infection.   On arrival to ED he was hypothermic, tachycardic.  Initial work up shows lactic acid of 2.4, creatinine of 1.6., troponin of 88 and wbc count of 13,600.  He was given a liter of RL and empiric antibiotics with IV vancomycin  and IV cefepime .  Repeat lactic acid this am is down to 1.1 an wbc count normalized.   UA AND CXR negative. Respiratory panel negative.    He was referred to [facility] for admission for evaluation of acute metabolic encephalopathy.    Review of Systems: unable to review all systems due to the inability of the patient to answer questions. Past Medical History:  Diagnosis Date   CHF (congestive heart failure) (HCC)    Diabetes mellitus    GERD (gastroesophageal reflux disease)    Heart attack (HCC)    History of bladder problems    Hypertension    Intermittent explosive disorder    Sinus complaint    Sleep apnea    Visual impairment    History  reviewed. No pertinent surgical history. Social History:  reports that he has quit smoking. He has never used smokeless tobacco. He reports that he does not drink alcohol  and does not use drugs.  Allergies[1]  Family History  Problem Relation Age of Onset   Arthritis Other    Cancer Other    Diabetes Other    Hypertension Other    Gout Other     Prior to Admission medications  Medication Sig Start Date End Date Taking? Authorizing Provider  ACCU-CHEK AVIVA PLUS test strip check blood sugar TWICE DAILY 10/21/21   [provider]  albuterol  (VENTOLIN  HFA) 108 (90 Base) MCG/ACT inhaler Inhale 2 puffs into the lungs every 4 (four) hours as needed. 03/08/20   [provider]  amantadine  (SYMMETREL ) 100 MG capsule Take 100 mg by mouth every morning. 06/29/22   [provider]  amitriptyline  (ELAVIL ) 25 MG tablet Take 25 mg by mouth at bedtime.      [provider]  amitriptyline  (ELAVIL ) 50 MG tablet Take 50 mg by mouth at bedtime. 06/03/24   [provider]  carvedilol  (COREG ) 3.125 MG tablet Take 3.125 mg by mouth 2 (two) times daily with a meal.      [provider]  desmopressin  (DDAVP ) 0.2 MG tablet TAKE TWO TABLETS EVERY DAY Patient taking differently: Take 0.4 mg by mouth at bedtime. 03/24/21  McKenzie, Belvie CROME, MD  digoxin  (LANOXIN ) 0.25 MG tablet Take 250 mcg by mouth daily.      [provider]  famotidine  (PEPCID ) 20 MG tablet Take 20 mg by mouth at bedtime.    [provider]  furosemide (LASIX) 40 MG tablet Take 40 mg by mouth daily. 04/22/24   [provider]  gabapentin  (NEURONTIN ) 400 MG capsule Take 1,200 mg by mouth at bedtime. Takes all three capsules at bedtime.    [provider]  glipiZIDE -metformin  (METAGLIP ) 5-500 MG tablet Take 1 tablet by mouth daily. 07/31/19   [provider]  hydroxypropyl methylcellulose / hypromellose (ISOPTO TEARS / GONIOVISC) 2.5 % ophthalmic  solution Place 1 drop into the left eye 4 (four) times daily as needed for dry eyes. 04/18/23   Currieo, Andrew D, MD  LORazepam  (ATIVAN ) 1 MG tablet Take 1 mg by mouth at bedtime.    [provider]  neomycin-polymyxin-pramoxine (NEOSPORIN  PLUS) 1 % cream Apply to left foot lacerations once daily until healed. 03/31/20   Gaynel Delon CROME, DPM  oseltamivir  (TAMIFLU ) 30 MG capsule Take 1 capsule (30 mg total) by mouth 2 (two) times daily. Take 1 capsule tonight, then one capsule twice daily for two more days. 07/17/22   Karna Fellows, MD  potassium chloride  (KLOR-CON ) 10 MEQ tablet Take 20 mEq by mouth daily. 03/08/20   [provider]  Promethazine  HCl 6.25 MG/5ML SOLN SMARTSIG:1 Teaspoon By Mouth Every 4-6 Hours PRN 03/23/21   [provider]  QC LO-DOSE ASPIRIN  81 MG EC tablet Take 81 mg by mouth daily. 07/31/19   [provider]  risperiDONE  (RISPERDAL ) 3 MG tablet Take 3 mg by mouth 2 (two) times daily.      [provider]  SYSTANE COMPLETE PF 0.6 % SOLN Place 1 drop into both eyes 6 (six) times daily. 06/28/22   [provider]  tamsulosin  (FLOMAX ) 0.4 MG CAPS capsule Take 0.4 mg by mouth at bedtime. 07/24/21   [provider]  theophylline  (THEODUR) 450 MG 12 hr tablet Take 450 mg by mouth daily. 06/18/24   [provider]  theophylline  (UNIPHYL) 400 MG 24 hr tablet Take 400 mg by mouth daily. 09/15/19   [provider]  Transparent Dressings (NEXCARE TEGADERM 4X4-3/4) MISC Apply topically as directed. 03/16/20   [provider]  traZODone  (DESYREL ) 50 MG tablet Take 50 mg by mouth at bedtime. 07/13/18   [provider]    Physical Exam: Vitals:   07/24/24 0645 07/24/24 0740 07/24/24 0820 07/24/24 0920  BP:  (!) 147/85 (!) 168/81 (!) 167/73  Pulse: 95 97 96 84  Resp: 14 16 13 14   Temp: 98 F (36.7 C) 98.2 F (36.8 C) 97.8 F (36.6 C) 98.5 F (36.9 C)  TempSrc:    Oral  SpO2: 99% 97% 99% 96%    General exam: Appears calm and comfortable  Respiratory system: Clear to auscultation. Respiratory effort normal. Cardiovascular system: S1 & S2 heard, RRR.  Gastrointestinal system: Abdomen is nondistended, soft and nontender.  Central nervous system: Alert and oriented to person only, has baseline cognitive deficits.  Extremities: no pedal edema.  Skin: No rashes,  Psychiatry: no agitation noted.   Data Reviewed: Results for orders placed or performed during the hospital encounter of 07/23/24 (from the past 24 hours)  CBC with Differential     Status: Abnormal   Collection Time: 07/23/24 10:20 AM  Result Value Ref Range   WBC 13.6 (H) 4.0 -  10.5 K/uL   RBC 5.30 4.22 - 5.81 MIL/uL   Hemoglobin 15.1 13.0 - 17.0 g/dL   HCT 56.8 60.9 - 47.9 %   MCV 81.3 80.0 - 100.0 fL   MCH 28.5 26.0 - 34.0 pg   MCHC 35.0 30.0 - 36.0 g/dL   RDW 87.7 88.4 - 84.4 %   Platelets 243 150 - 400 K/uL   nRBC 0.0 0.0 - 0.2 %   Neutrophils Relative % 85 %   Neutro Abs 11.7 (H) 1.7 - 7.7 K/uL   Lymphocytes Relative 7 %   Lymphs Abs 0.9 0.7 - 4.0 K/uL   Monocytes Relative 6 %   Monocytes Absolute 0.8 0.1 - 1.0 K/uL   Eosinophils Relative 1 %   Eosinophils Absolute 0.1 0.0 - 0.5 K/uL   Basophils Relative 0 %   Basophils Absolute 0.1 0.0 - 0.1 K/uL   Immature Granulocytes 1 %   Abs Immature Granulocytes 0.11 (H) 0.00 - 0.07 K/uL  Comprehensive metabolic panel     Status: Abnormal   Collection Time: 07/23/24 10:20 AM  Result Value Ref Range   Sodium 136 135 - 145 mmol/L   Potassium 4.7 3.5 - 5.1 mmol/L   Chloride 95 (L) 98 - 111 mmol/L   CO2 30 22 - 32 mmol/L   Glucose, Bld 365 (H) 70 - 99 mg/dL   BUN 29 (H) 6 - 20 mg/dL   Creatinine, Ser 8.33 (H) 0.61 - 1.24 mg/dL   Calcium 89.6 8.9 - 89.6 mg/dL   Total Protein 7.7 6.5 - 8.1 g/dL   Albumin 4.0 3.5 - 5.0 g/dL   AST 29 15 - 41 U/L   ALT 31 0 - 44 U/L   Alkaline Phosphatase 159 (H) 38 - 126 U/L   Total Bilirubin 0.4 0.0 - 1.2 mg/dL   GFR,  Estimated 49 (L) >60 mL/min   Anion gap 11 5 - 15  Lipase, blood     Status: None   Collection Time: 07/23/24 10:20 AM  Result Value Ref Range   Lipase 35 11 - 51 U/L  Magnesium     Status: None   Collection Time: 07/23/24 10:20 AM  Result Value Ref Range   Magnesium 2.0 1.7 - 2.4 mg/dL  Phosphorus     Status: None   Collection Time: 07/23/24 10:20 AM  Result Value Ref Range   Phosphorus 4.4 2.5 - 4.6 mg/dL  Troponin T, High Sensitivity     Status: Abnormal   Collection Time: 07/23/24 10:20 AM  Result Value Ref Range   Troponin T High Sensitivity 93 (H) 0 - 19 ng/L  Lactic acid, plasma     Status: Abnormal   Collection Time: 07/23/24 11:02 AM  Result Value Ref Range   Lactic Acid, Venous 2.4 (HH) 0.5 - 1.9 mmol/L  Protime-INR     Status: None   Collection Time: 07/23/24 11:02 AM  Result Value Ref Range   Prothrombin Time 14.3 11.4 - 15.2 seconds   INR 1.1 0.8 - 1.2  Blood Culture (routine x 2)     Status: None (Preliminary result)   Collection Time: 07/23/24 11:02 AM   Specimen: BLOOD  Result Value Ref Range   Specimen Description      BLOOD RIGHT ANTECUBITAL Performed at [facility], [address] [place], [place] [address]    Special Requests      Blood Culture adequate volume BOTTLES DRAWN AEROBIC AND ANAEROBIC Performed at [facility], [address]  [place], [place] [address]    Culture      NO GROWTH < 24 HOURS Performed at [facility], [address], [place], [place] [address]    Report Status PENDING   TSH     Status: None   Collection Time: 07/23/24 11:02 AM  Result Value Ref Range   TSH 1.710 0.350 - 4.500 uIU/mL  Blood Culture (routine x 2)     Status: None (Preliminary result)   Collection Time: 07/23/24 11:07 AM   Specimen: BLOOD  Result Value Ref Range   Specimen Description      BLOOD LEFT ANTECUBITAL Performed at [facility], [address], [place], [place] [address]     Special Requests      Blood Culture adequate volume BOTTLES DRAWN AEROBIC AND ANAEROBIC Performed at [facility], [address], [place], [place] [address]    Culture      NO GROWTH < 24 HOURS Performed at [facility], [address] [place], [place] [address]    Report Status PENDING   Urinalysis, w/ Reflex to Culture (Infection Suspected) -Urine, Clean Catch     Status: Abnormal   Collection Time: 07/23/24 12:40 PM  Result Value Ref Range   Specimen Source URINE, CLEAN CATCH    Color, Urine YELLOW YELLOW   APPearance CLEAR CLEAR   Specific Gravity, Urine 1.013 1.005 - 1.030   pH 5.5 5.0 - 8.0   Glucose, UA 500 (A) NEGATIVE mg/dL   Hgb urine dipstick TRACE (A) NEGATIVE   Bilirubin Urine NEGATIVE NEGATIVE   Ketones, ur NEGATIVE NEGATIVE mg/dL   Protein, ur NEGATIVE NEGATIVE mg/dL   Nitrite NEGATIVE NEGATIVE   Leukocytes,Ua NEGATIVE NEGATIVE   RBC / HPF 0-5 0 - 5 RBC/hpf   WBC, UA 0-5 0 - 5 WBC/hpf   Bacteria, UA NONE SEEN NONE SEEN   Squamous Epithelial / HPF 0-5 0 - 5 /HPF   Hyaline Casts, UA PRESENT   Resp panel by RT-PCR (RSV, Flu A&B, Covid) Urine, Clean Catch     Status: None   Collection Time: 07/23/24 12:40 PM   Specimen: Urine, Clean Catch; Nasal Swab  Result Value Ref Range   SARS Coronavirus 2 by RT PCR NEGATIVE NEGATIVE   Influenza A by PCR NEGATIVE NEGATIVE   Influenza B by PCR NEGATIVE NEGATIVE   Resp Syncytial Virus by PCR NEGATIVE NEGATIVE  Lactic acid, plasma     Status: None   Collection Time: 07/23/24  1:40 PM  Result Value Ref Range   Lactic Acid, Venous 1.1 0.5 - 1.9 mmol/L  Troponin T, High Sensitivity     Status: Abnormal   Collection Time: 07/23/24  1:40 PM  Result Value Ref Range   Troponin T High Sensitivity 88 (H) 0 - 19 ng/L  I-Stat venous blood gas, ED     Status: Abnormal   Collection Time: 07/23/24  1:47 PM  Result Value Ref Range   pH, Ven 7.453 (H) 7.25 - 7.43   pCO2, Ven 45.2 44 - 60 mmHg   pO2, Ven 62  (H) 32 - 45 mmHg   Bicarbonate 32.0 (H) 20.0 - 28.0 mmol/L   TCO2 33 (H) 22 - 32 mmol/L   O2 Saturation 94 %   Acid-Base Excess 7.0 (H) 0.0 - 2.0 mmol/L   Sodium 138 135 - 145 mmol/L   Potassium 4.2 3.5 - 5.1 mmol/L   Calcium, Ion 1.23 1.15 - 1.40 mmol/L   HCT 38.0 (L)  39.0 - 52.0 %   Hemoglobin 12.9 (L) 13.0 - 17.0 g/dL   Patient temperature 03.9 F    Collection site IV start    Drawn by Nurse    Sample type VENOUS   Cortisol     Status: None   Collection Time: 07/23/24  4:20 PM  Result Value Ref Range   Cortisol, Plasma 13.7 ug/dL  Digoxin  level     Status: None   Collection Time: 07/23/24  4:20 PM  Result Value Ref Range   Digoxin  Level 1.8 0.8 - 2.0 ng/mL  Theophylline  level     Status: None   Collection Time: 07/23/24  4:20 PM  Result Value Ref Range   Theophylline  Lvl 13.8 10.0 - 20.0 ug/mL  Basic metabolic panel     Status: Abnormal   Collection Time: 07/24/24  6:41 AM  Result Value Ref Range   Sodium 136 135 - 145 mmol/L   Potassium 4.2 3.5 - 5.1 mmol/L   Chloride 95 (L) 98 - 111 mmol/L   CO2 31 22 - 32 mmol/L   Glucose, Bld 457 (H) 70 - 99 mg/dL   BUN 23 (H) 6 - 20 mg/dL   Creatinine, Ser 8.45 (H) 0.61 - 1.24 mg/dL   Calcium 9.3 8.9 - 89.6 mg/dL   GFR, Estimated 53 (L) >60 mL/min   Anion gap 9 5 - 15  CBC with Differential     Status: Abnormal   Collection Time: 07/24/24  6:41 AM  Result Value Ref Range   WBC 8.7 4.0 - 10.5 K/uL   RBC 4.66 4.22 - 5.81 MIL/uL   Hemoglobin 13.3 13.0 - 17.0 g/dL   HCT 62.7 (L) 60.9 - 47.9 %   MCV 79.8 (L) 80.0 - 100.0 fL   MCH 28.5 26.0 - 34.0 pg   MCHC 35.8 30.0 - 36.0 g/dL   RDW 88.0 88.4 - 84.4 %   Platelets 202 150 - 400 K/uL   nRBC 0.0 0.0 - 0.2 %   Neutrophils Relative % 80 %   Neutro Abs 6.9 1.7 - 7.7 K/uL   Lymphocytes Relative 10 %   Lymphs Abs 0.9 0.7 - 4.0 K/uL   Monocytes Relative 8 %   Monocytes Absolute 0.7 0.1 - 1.0 K/uL   Eosinophils Relative 1 %   Eosinophils Absolute 0.1 0.0 - 0.5 K/uL   Basophils  Relative 0 %   Basophils Absolute 0.0 0.0 - 0.1 K/uL   Immature Granulocytes 1 %   Abs Immature Granulocytes 0.04 0.00 - 0.07 K/uL  CBG monitoring, ED     Status: Abnormal   Collection Time: 07/24/24  8:11 AM  Result Value Ref Range   Glucose-Capillary 469 (H) 70 - 99 mg/dL  Glucose, capillary     Status: Abnormal   Collection Time: 07/24/24  9:14 AM  Result Value Ref Range   Glucose-Capillary 572 (HH) 70 - 99 mg/dL     Assessment and Plan:  Acute metabolic encephalopathy/ Recurrent falls since 2 weeks.  In the setting of baseline cognitive impairment.  Presents with hypothermia, elevated lactic acid and leukocytosis.  As per the mom , patient has been more confused  lately, alternating between agitation and depression.  Differential include dehydration, non compliance to medications.  Initial CT head without contrast is negative for acute findings.  MRI of the brain with and without contrast ordered for further evaluation. Lactic ac normalized with IV fluids. No signs of infection so far, chest x-ray and UA negative for infection  Patient was empirically given a dose of IV vancomycin  and cefepime  in ED. Will hold off on the antibiotics for now. Failure and antigen levels have been within normal limits. Please call neurology if MRI of the brain is abnormal.    Hypertension Well controlled BP parameters.  Restart home meds once pharmacy able to verify them.     H/o HFpEF Patient appears euvolemic.  He was give a liter of RL in ED yesterday.     Type 2 diabetes mellitus, non-insulin -dependent, uncontrolled with hyperglycemia Check hemoglobin A1c and start the patient on moderate sliding scale insulin .   Intermittent explosive disorder.  Resume home meds.   GERD  Stable.    H/o stage 2 CKD Creatinine appears to be at baseline.    Advance Care Planning:   Code Status: Full Code   Consults:   Family Communication:   Severity of Illness: The appropriate  patient status for this patient is OBSERVATION. Observation status is judged to be reasonable and necessary in order to provide the required intensity of service to ensure the patient's safety. The patient's presenting symptoms, physical exam findings, and initial radiographic and laboratory data in the context of their medical condition is felt to place them at decreased risk for further clinical deterioration. Furthermore, it is anticipated that the patient will be medically stable for discharge from the hospital within 2 midnights of admission.   Author: Elgie Butter, MD 07/24/2024 9:58 AM  For on call review [email or web address].      [1] No Known Allergies  "

## 2024-07-24 NOTE — Progress Notes (Signed)
 PHARMACY - PHYSICIAN COMMUNICATION CRITICAL VALUE ALERT - BLOOD CULTURE IDENTIFICATION (BCID)  Ryan Case is an 54 y.o. male who presented to [facility] on [facility]/23/[facility]24 with AMS after a fall.  Assessment:  1/4 blood cultures: GPC in clusters no identified by BCID  WBC trending down, lactate WNL, afebrile (was hypothermic upon admission) Imaging negative for signs of infection  Name of physician (or Provider) Contacted: Erminio Cone  Current antibiotics: N/A  Changes to prescribed antibiotics recommended:   -Continue to monitor patient off antibiotics given most likely contamination   Results for orders placed or performed during the hospital encounter of 07/23/24  Blood Culture ID Panel (Reflexed) (Collected: 07/23/2024 11:07 AM)  Result Value Ref Range   Enterococcus faecalis NOT DETECTED NOT DETECTED   Enterococcus Faecium NOT DETECTED NOT DETECTED   Listeria monocytogenes NOT DETECTED NOT DETECTED   Staphylococcus species NOT DETECTED NOT DETECTED   Staphylococcus aureus (BCID) NOT DETECTED NOT DETECTED   Staphylococcus epidermidis NOT DETECTED NOT DETECTED   Staphylococcus lugdunensis NOT DETECTED NOT DETECTED   Streptococcus species NOT DETECTED NOT DETECTED   Streptococcus agalactiae NOT DETECTED NOT DETECTED   Streptococcus pneumoniae NOT DETECTED NOT DETECTED   Streptococcus pyogenes NOT DETECTED NOT DETECTED   A.calcoaceticus-baumannii NOT DETECTED NOT DETECTED   Bacteroides fragilis NOT DETECTED NOT DETECTED   Enterobacterales NOT DETECTED NOT DETECTED   Enterobacter cloacae complex NOT DETECTED NOT DETECTED   Escherichia coli NOT DETECTED NOT DETECTED   Klebsiella aerogenes NOT DETECTED NOT DETECTED   Klebsiella oxytoca NOT DETECTED NOT DETECTED   Klebsiella pneumoniae NOT DETECTED NOT DETECTED   Proteus species NOT DETECTED NOT DETECTED   Salmonella species NOT DETECTED NOT DETECTED   Serratia marcescens NOT DETECTED NOT DETECTED   Haemophilus influenzae  NOT DETECTED NOT DETECTED   Neisseria meningitidis NOT DETECTED NOT DETECTED   Pseudomonas aeruginosa NOT DETECTED NOT DETECTED   Stenotrophomonas maltophilia NOT DETECTED NOT DETECTED   Candida albicans NOT DETECTED NOT DETECTED   Candida auris NOT DETECTED NOT DETECTED   Candida glabrata NOT DETECTED NOT DETECTED   Candida krusei NOT DETECTED NOT DETECTED   Candida parapsilosis NOT DETECTED NOT DETECTED   Candida tropicalis NOT DETECTED NOT DETECTED   Cryptococcus neoformans/gattii NOT DETECTED NOT DETECTED    Lynwood Poplar, PharmD, BCPS Clinical Pharmacist 07/24/2024 11:42 PM

## 2024-07-24 NOTE — ED Notes (Signed)
 Called Mr.Hayes at the group home to give him patients RM number

## 2024-07-25 DIAGNOSIS — T68XXXA Hypothermia, initial encounter: Secondary | ICD-10-CM | POA: Diagnosis not present

## 2024-07-25 DIAGNOSIS — G9341 Metabolic encephalopathy: Secondary | ICD-10-CM | POA: Diagnosis not present

## 2024-07-25 LAB — CBC WITH DIFFERENTIAL/PLATELET
Abs Immature Granulocytes: 0.09 K/uL — ABNORMAL HIGH (ref 0.00–0.07)
Basophils Absolute: 0.1 K/uL (ref 0.0–0.1)
Basophils Relative: 1 %
Eosinophils Absolute: 0.1 K/uL (ref 0.0–0.5)
Eosinophils Relative: 1 %
HCT: 34.8 % — ABNORMAL LOW (ref 39.0–52.0)
Hemoglobin: 12.3 g/dL — ABNORMAL LOW (ref 13.0–17.0)
Immature Granulocytes: 1 %
Lymphocytes Relative: 19 %
Lymphs Abs: 1.5 K/uL (ref 0.7–4.0)
MCH: 28.7 pg (ref 26.0–34.0)
MCHC: 35.3 g/dL (ref 30.0–36.0)
MCV: 81.3 fL (ref 80.0–100.0)
Monocytes Absolute: 0.9 K/uL (ref 0.1–1.0)
Monocytes Relative: 11 %
Neutro Abs: 5.1 K/uL (ref 1.7–7.7)
Neutrophils Relative %: 67 %
Platelets: 206 K/uL (ref 150–400)
RBC: 4.28 MIL/uL (ref 4.22–5.81)
RDW: 12.1 % (ref 11.5–15.5)
WBC: 7.7 K/uL (ref 4.0–10.5)
nRBC: 0 % (ref 0.0–0.2)

## 2024-07-25 LAB — BASIC METABOLIC PANEL WITH GFR
Anion gap: 6 (ref 5–15)
BUN: 22 mg/dL — ABNORMAL HIGH (ref 6–20)
CO2: 31 mmol/L (ref 22–32)
Calcium: 9 mg/dL (ref 8.9–10.3)
Chloride: 103 mmol/L (ref 98–111)
Creatinine, Ser: 1.38 mg/dL — ABNORMAL HIGH (ref 0.61–1.24)
GFR, Estimated: >60 mL/min
Glucose, Bld: 176 mg/dL — ABNORMAL HIGH (ref 70–99)
Potassium: 4.5 mmol/L (ref 3.5–5.1)
Sodium: 140 mmol/L (ref 135–145)

## 2024-07-25 LAB — CULTURE, BLOOD (ROUTINE X 2): Special Requests: ADEQUATE

## 2024-07-25 LAB — GLUCOSE, CAPILLARY
Glucose-Capillary: 126 mg/dL — ABNORMAL HIGH (ref 70–99)
Glucose-Capillary: 255 mg/dL — ABNORMAL HIGH (ref 70–99)

## 2024-07-25 LAB — VITAMIN B12: Vitamin B-12: 939 pg/mL — ABNORMAL HIGH (ref 180–914)

## 2024-07-25 LAB — SYPHILIS: RPR W/REFLEX TO RPR TITER AND TREPONEMAL ANTIBODIES, TRADITIONAL SCREENING AND DIAGNOSIS ALGORITHM: RPR Ser Ql: NONREACTIVE

## 2024-07-25 MED ORDER — LORAZEPAM 1 MG PO TABS
1.0000 mg | ORAL_TABLET | Freq: Once | ORAL | Status: AC | PRN
  Administered 2024-07-25: 1 mg via ORAL
  Filled 2024-07-25: qty 1

## 2024-07-25 NOTE — Evaluation (Addendum)
 Physical Therapy Evaluation Patient Details Name: Ryan Case MRN: [id number] DOB: [birth year] Today's Date: 07/25/2024  History of Present Illness  54 yo M adm 07/23/24 with increased falls, AMS, hypothermia, encephalopathy. PMhx: CHF, DM, intellectual disability, HOH, HTN, intermittent explosive d/o, sleep apnea  Clinical Impression  Pt in bed on arrival, unaware of breakfast try or Ryan Case in room. Pt motivated to move by food and Ryan Case able to provide home setup and PLOF. Pt has been in group home for 30 years and would benefit from return to familiar environment if staff are willing/able to provide initial increased physical assist to prevent falls, train pt for RW use and limit need for stairs. Pt has bedroom upstairs with multiple falls occurring on stairs and Ryan Case states she thinks pt may be able to move to 1st floor of group home. Ryan Case demonstrates decreased cognition, strength, balance, safety and awareness who will benefit from acute therapy to maximize mobility and safety and educate for DME use. Encouraged OOB with staff and use of RW.   HHPT with RW and 24hr assist recommended.      If plan is discharge home, recommend the following: A lot of help with walking and/or transfers;A lot of help with bathing/dressing/bathroom;Assist for transportation;Help with stairs or ramp for entrance;Direct supervision/assist for medications management;Direct supervision/assist for financial management;Supervision due to cognitive status   Can travel by private vehicle        Equipment Recommendations Rolling walker (2 wheels)  Recommendations for Other Services       Functional Status Assessment Patient has had a recent decline in their functional status and demonstrates the ability to make significant improvements in function in a reasonable and predictable amount of time.     Precautions / Restrictions Precautions Precautions: Fall Recall of Precautions/Restrictions:  Impaired Precaution/Restrictions Comments: multiple falls with abrasions on legs and face      Mobility  Bed Mobility Overal bed mobility: Needs Assistance Bed Mobility: Supine to Sit     Supine to sit: Min assist     General bed mobility comments: min assist to initiate movement of legs to EOB and to elevate trunk from surface    Transfers Overall transfer level: Needs assistance   Transfers: Sit to/from Stand Sit to Stand: Min assist, +2 safety/equipment           General transfer comment: min assist with multimodal cues for hand placement and safety to rise from bed and chair, increased time to initiate    Ambulation/Gait Ambulation/Gait assistance: Min assist, +2 safety/equipment, Mod assist Gait Distance (Feet): 8 Feet Assistive device: Rolling walker (2 wheels), None Gait Pattern/deviations: Step-through pattern, Decreased stride length, Trunk flexed   Gait velocity interpretation: <1.31 ft/sec, indicative of household ambulator   General Gait Details: pt impulsive with initial gait requiring mod assist for balance and safety with pt reaching out in all directions walking 4' with close chair follow. Then transitioned to RW with improved stability and pt able to walk 8' with chair follow and +2 for safety with decreased awareness and attention to benefit of RW, wil require extensive repetition  Stairs            Wheelchair Mobility     Tilt Bed    Modified Rankin (Stroke Patients Only)       Balance Overall balance assessment: Needs assistance Sitting-balance support: Feet supported, No upper extremity supported Sitting balance-Leahy Scale: Fair Sitting balance - Comments: EOB with CGA   Standing balance support: No upper extremity  supported, Bilateral upper extremity supported, Reliant on assistive device for balance, During functional activity Standing balance-Leahy Scale: Poor Standing balance comment: mod assist without UB support                              Pertinent Vitals/Pain Pain Assessment Pain Assessment: No/denies pain    Home Living Family/patient expects to be discharged to:: Group home Living Arrangements: Group Home Available Help at Discharge: Available PRN/intermittently;Personal care attendant Type of Home: House Home Access: Stairs to enter   Entrance Stairs-Number of Steps: 3 Alternate Level Stairs-Number of Steps: 14 Home Layout: Two level;Bed/bath upstairs Home Equipment: Grab bars - tub/shower;Grab bars - toilet      Prior Function Prior Level of Function : Needs assist             Mobility Comments: walks without AD but repeated falls with increased occurance ADLs Comments: assist for IADLs, completes ADLs with reminders, day program     Extremity/Trunk Assessment   Upper Extremity Assessment Upper Extremity Assessment: Generalized weakness    Lower Extremity Assessment Lower Extremity Assessment: Generalized weakness    Cervical / Trunk Assessment Cervical / Trunk Assessment: Kyphotic  Communication   Communication Communication: Impaired Factors Affecting Communication: Hearing impaired;Difficulty expressing self;Reduced clarity of speech    Cognition Arousal: Alert Behavior During Therapy: Flat affect   PT - Cognitive impairments: Sequencing, Problem solving, Awareness, Safety/Judgement, Attention, Initiation                       PT - Cognition Comments: pt varies between pleasant and agitated, fixated on breakfast tray as motivating factor, HOH and lack of awareness for commands, safety and fall risk Following commands: Impaired Following commands impaired: Follows one step commands inconsistently, Follows one step commands with increased time     Cueing Cueing Techniques: Verbal cues, Gestural cues, Tactile cues     General Comments      Exercises     Assessment/Plan    PT Assessment Patient needs continued PT services  PT Problem List  Decreased strength;Decreased coordination;Decreased cognition;Decreased activity tolerance;Decreased balance;Decreased mobility;Decreased safety awareness;Decreased knowledge of use of DME       PT Treatment Interventions DME instruction;Gait training;Stair training;Functional mobility training;Therapeutic activities;Patient/family education;Cognitive remediation;Balance training;Therapeutic exercise;Neuromuscular re-education    PT Goals (Current goals can be found in the Care Plan section)  Acute Rehab PT Goals Patient Stated Goal: return to group home PT Goal Formulation: With family Time For Goal Achievement: 08/08/24 Potential to Achieve Goals: Good    Frequency Min 2X/week     Co-evaluation               AM-PAC PT 6 Clicks Mobility  Outcome Measure Help needed turning from your back to your side while in a flat bed without using bedrails?: A Little Help needed moving from lying on your back to sitting on the side of a flat bed without using bedrails?: A Little Help needed moving to and from a bed to a chair (including a wheelchair)?: A Lot Help needed standing up from a chair using your arms (e.g., wheelchair or bedside chair)?: A Little Help needed to walk in hospital room?: Total Help needed climbing 3-5 steps with a railing? : Total 6 Click Score: 13    End of Session Equipment Utilized During Treatment: Gait belt Activity Tolerance: Patient tolerated treatment well Patient left: in chair;with call bell/phone within reach;with chair alarm  set;with family/visitor present Nurse Communication: Mobility status PT Visit Diagnosis: Other abnormalities of gait and mobility (R26.89);Difficulty in walking, not elsewhere classified (R26.2);Muscle weakness (generalized) (M62.81)    Time: 9159-9099 PT Time Calculation (min) (ACUTE ONLY): 20 min   Charges:   PT Evaluation $PT Eval Moderate Complexity: 1 Mod   PT General Charges $$ ACUTE PT VISIT: 1 Visit          Lenoard SQUIBB, PT Acute Rehabilitation Services Office: [phone]   Cecile Gillispie B Katherin Ramey 07/25/2024, 9:15 AM

## 2024-07-25 NOTE — TOC Transition Note (Addendum)
 Transition of Care (TOC) - Discharge Note Rayfield Gobble RN,BSN Inpatient Care Management Unit 4NP (Non Trauma)- RN Case Manager See Treatment Team for direct Phone #   Patient Details  Name: Ryan Case MRN: [id number] Date of Birth: 09-25-1970  Transition of [facility]) CM/SW Contact:  Gobble Rayfield Hurst, RN Phone Number: 07/25/2024, 1:03 PM   Clinical Narrative:    Pt stable for transition back to Group Home today. CSW has spoken with Group Home and confirmed that pt can return w/ [facility]- Group Home does not have a preference for [facility] provider per CSW.   Referrals sent out in the Hub- [facility] accepted and CM confirmed referral.   DME referral sent to [employer] for rolling walker- RW to be delivered to pt prior to discharge.  1315- updated by liaison that pt already has transport chair and unable to get RW under insurance at this time- due to Medicaid guidelines unable to pay out of pocket.  1510- spoke with mom/LG- [name]- she informed this CM that pt's PCP had ordered a Transport Chair and the group home confirmed it was delivered this week.   Per CSW - group home will be here around 3pm to transport.    Final next level of care: Home w Home Health Services Barriers to Discharge: Barriers Resolved   Patient Goals and CMS Choice Patient states their goals for this hospitalization and ongoing recovery are:: return to Group Home   Choice offered to / list presented to : [facility] POA / Guardian      Discharge Placement                 Group Home w/ [facility]      Discharge Plan and Services Additional resources added to the After Visit Summary for   In-house Referral: Clinical Social Work Discharge Planning Services: CM Consult Post Acute Care Choice: Home Health, Durable Medical Equipment          DME Arranged: Walker rolling DME Agency: [employer] Date DME Agency Contacted: 07/25/24 Time DME Agency Contacted: 1302 Representative spoke with at DME Agency: [facility]  Arranged: PT, OT HH Agency: [facility] Date HH Agency Contacted: 07/25/24 Time HH Agency Contacted: 1302 Representative spoke with at [facility] Agency: Hub  Social Drivers of Health (SDOH) Interventions SDOH Screenings   Food Insecurity: Patient Unable To Answer (07/24/2024)  Housing: Patient Unable To Answer (07/24/2024)  Transportation Needs: Patient Unable To Answer (07/24/2024)  Utilities: Patient Unable To Answer (07/24/2024)  Tobacco Use: Medium Risk (07/23/2024)     Readmission Risk Interventions     No data to display

## 2024-07-25 NOTE — Plan of Care (Signed)

## 2024-07-25 NOTE — Progress Notes (Signed)
 Order to discharge patient to group home. Family at bedside. Discharge instructions/AVS given to and reviewed with patient's mother and group home staff. Education provided as needed. Patient verbalized understanding. 2 PIVS removed by this RN. Personal belongings sent home with the patient. Group home via private vehicle with facility's staff, [place].

## 2024-07-25 NOTE — Hospital Course (Signed)
 Ryan Case is a 54 y.o. male with a history of diabetes mellitus type 2, hypertension, HFpEF, GERD, cognitive impairment.  Patient presented secondary to recurrent falls and mental status change and found to have hypothermia of unclear etiology. Infection workup negative. Patient remained stable during hospitalization and discharge back to group home.

## 2024-07-25 NOTE — Discharge Summary (Signed)
 " Physician Discharge Summary   Patient: Ryan Case MRN: [id number] DOB: 03-Dec-1970  Admit date:     07/23/2024  Discharge date: 07/25/24  Discharge Physician: Elgin Lam, MD   PCP: Ilah Crigler, MD   Recommendations at discharge:  PCP visit for hospital follow-up  Discharge Diagnoses: Principal Problem:   Hypothermia Active Problems:   Diabetes mellitus (HCC)   CHF (congestive heart failure) (HCC)   Mild intellectual disability   Hypertension   GERD (gastroesophageal reflux disease)  Resolved Problems:   * No resolved hospital problems. *  Hospital Course: MICHALL NOFFKE is a 54 y.o. male with a history of diabetes mellitus type 2, hypertension, HFpEF, GERD, cognitive impairment.  Patient presented secondary to recurrent falls and mental status change and found to have hypothermia of unclear etiology. Infection workup negative. Patient remained stable during hospitalization and discharge back to group home.  Assessment and Plan:  Acute metabolic encephalopathy Present on admission but quickly resolved. Unclear etiology. Associated hypothermia of unclear etiology. Concern initially for possible sepsis related presentation. Empiric antibiotics were given in the ED, but continued. CT head and MRI brain without acute process to explain mental status change. Blood cultures obtained and 1/4 sample significant for micrococcus luteus/lylae, compatible with contaminant. Chest x-ray, urinalysis, respiratory virus panel and COVID-19/RSV/influenza negative. Patient remained at baseline prior to discharge.  Hypothermia Present on admission. Unclear etiology. Patient required rewarming in the emergency department, but reached adequate core temperature prior to arrival to the medical floor. Patient remained normothermic during hospitalization without rewarming.  Primary hypertension Continue carvedilol .  Chronic HFpEF Noted. Euvolemic. Stable.  Diabetes mellitus type 2 Poorly  controlled based on hemoglobin A1C of 11.4%. Prior to arrival medication(s) include glipizide . Recommend adjustment of diabetes regimen as an outpatient for better glycemic control.  Intermittent explosive disorder Continue Elavil , amantadine , lorazepam .  GERD Continue famotidine .  CKD stage II Noted. Baseline creatinine of 1.3 to 1.4.   Consultants: None Procedures performed: None  Disposition: Group home Diet recommendation: Carb modified diet   DISCHARGE MEDICATION: Allergies as of 07/25/2024   No Known Allergies      Medication List     STOP taking these medications    ibuprofen 200 MG tablet Commonly known as: ADVIL       TAKE these medications    amantadine  100 MG capsule Commonly known as: SYMMETREL  Take 100 mg by mouth every morning. The timing of this medication is very important.   acetaminophen  500 MG tablet Commonly known as: TYLENOL  Take 500-1,000 mg by mouth every 6 (six) hours as needed for mild pain (pain score 1-3).   albuterol  108 (90 Base) MCG/ACT inhaler Commonly known as: VENTOLIN  HFA Inhale 2 puffs into the lungs every 4 (four) hours as needed for wheezing or shortness of breath.   amitriptyline  50 MG tablet Commonly known as: ELAVIL  Take 50 mg by mouth at bedtime.   bismuth subsalicylate 262 MG/15ML suspension Commonly known as: PEPTO BISMOL Take 30 mLs by mouth every 6 (six) hours as needed for indigestion or diarrhea or loose stools.   carvedilol  3.125 MG tablet Commonly known as: COREG  Take 3.125 mg by mouth 2 (two) times daily with a meal.   desmopressin  0.2 MG tablet Commonly known as: DDAVP  TAKE TWO TABLETS EVERY DAY What changed: See the new instructions.   famotidine  20 MG tablet Commonly known as: PEPCID  Take 20 mg by mouth at bedtime.   furosemide 40 MG tablet Commonly known as: LASIX Take 40 mg by  mouth daily.   gabapentin  400 MG capsule Commonly known as: NEURONTIN  Take 1,200 mg by mouth at bedtime. Takes  all three capsules at bedtime.   glipiZIDE  10 MG tablet Commonly known as: GLUCOTROL  Take 10 mg by mouth daily.   Lanoxin  0.25 MG tablet Generic drug: digoxin  Take 0.25 mg by mouth daily.   LORazepam  1 MG tablet Commonly known as: ATIVAN  Take 1 mg by mouth at bedtime.   Melatonin 10 MG Caps Take 10 mg by mouth at bedtime.   potassium chloride  10 MEQ tablet Commonly known as: KLOR-CON  Take 20 mEq by mouth daily.   QC LO-DOSE ASPIRIN  81 MG tablet Generic drug: aspirin  EC Take 81 mg by mouth every evening.   RisperDAL  3 MG tablet Generic drug: risperiDONE  Take 3 mg by mouth 2 (two) times daily.   Systane Complete PF 0.6 % Soln Generic drug: Propylene Glycol (PF) Place 1 drop into both eyes 6 (six) times daily.   theophylline  400 MG 24 hr tablet Commonly known as: UNIPHYL Take 400 mg by mouth daily.   traZODone  50 MG tablet Commonly known as: DESYREL  Take 50 mg by mouth at bedtime.               Durable Medical Equipment  (From admission, onward)           Start     Ordered   07/25/24 1248  For home use only DME Walker rolling  Once       Question Answer Comment  Walker: With 5 Inch Wheels   Patient needs a walker to treat with the following condition Gait abnormality      07/25/24 1247            Follow-up Information     Ilah Crigler, MD. Schedule an appointment as soon as possible for a visit in 1 week(s).   Specialty: Family Medicine Why: For hospital follow-up Contact information: [address] [place] [address] [phone]                Discharge Exam: BP 122/74 (BP Location: Left Arm)   Pulse 98   Temp 97.8 F (36.6 C) (Oral)   Resp 16   SpO2 98%   General exam: Appears calm and comfortable. Respiratory system: Respiratory effort normal. Cardiovascular system: Patient declined for me to examine. Gastrointestinal system: Patient declined for me to examine. Central nervous system: Alert.  Condition at  discharge: stable  The results of significant diagnostics from this hospitalization (including imaging, microbiology, ancillary and laboratory) are listed below for reference.   Imaging Studies: MR BRAIN W WO CONTRAST Result Date: 07/24/2024 CLINICAL DATA:  Initial evaluation for acute mental status change, unknown cause. EXAM: MRI HEAD WITHOUT AND WITH CONTRAST TECHNIQUE: Multiplanar, multiecho pulse sequences of the brain and surrounding structures were obtained without and with intravenous contrast. CONTRAST:  6mL GADAVIST  GADOBUTROL  1 MMOL/ML IV SOLN COMPARISON:  CT from 07/23/2024. FINDINGS: Brain: Cerebral volume within normal limits. No significant cerebral white matter disease or other focal parenchymal signal abnormality. No abnormal foci of restricted diffusion to suggest acute or subacute ischemia. Gray-white matter differentiation maintained. No areas of chronic cortical infarction. No acute intracranial hemorrhage. Few scattered chronic micro hemorrhages noted within the left cerebral hemisphere, most prominent of which noted at the left frontal lobe (series 10, image 48). No mass lesion, midline shift or mass effect no hydrocephalus or extra-axial fluid collection. Pituitary gland suprasellar region within normal limits. No abnormal enhancement. Vascular: Major intracranial  vascular flow voids are maintained. Skull and upper cervical spine: Cranial junction within normal limits. Decreased T1 signal intensity noted within the visualized bone marrow, nonspecific, but most commonly related to anemia, smoking or obesity. No scalp soft tissue abnormality. Sinuses/Orbits: Globes and orbital soft tissues within normal limits. Axial myopia noted at the right globe. Mild mucosal thickening about the ethmoidal air cells. Paranasal sinuses are otherwise clear. No significant mastoid effusion. Other: None. IMPRESSION: 1. No acute intracranial abnormality. 2. Few scattered chronic micro hemorrhages within the  left cerebral hemisphere, nonspecific, but favored to be hypertensive in nature. 3. Otherwise normal brain MRI for age. Electronically Signed   By: Morene Hoard M.D.   On: 07/24/2024 22:30   DG Chest Port 1 View Result Date: 07/23/2024 EXAM: 1 VIEW(S) XRAY OF THE CHEST 07/23/2024 11:39:25 AM COMPARISON: 07/16/2022 CLINICAL HISTORY: Sepsis evaluation FINDINGS: LUNGS AND PLEURA: No focal pulmonary opacity. No pleural effusion. No pneumothorax. HEART AND MEDIASTINUM: No acute abnormality of the cardiac and mediastinal silhouettes. BONES AND SOFT TISSUES: No acute osseous abnormality. IMPRESSION: 1. No acute cardiopulmonary abnormality. Electronically signed by: Waddell Calk MD 07/23/2024 12:37 PM EST RP Workstation: [id number]   CT Head Wo Contrast Result Date: 07/23/2024 EXAM: CT HEAD WITHOUT CONTRAST 07/23/2024 11:26:58 AM TECHNIQUE: CT of the head was performed without the administration of intravenous contrast. Automated exposure control, iterative reconstruction, and/or weight based adjustment of the mA/kV was utilized to reduce the radiation dose to as low as reasonably achievable. COMPARISON: 07/15/2022 CLINICAL HISTORY: The patient presents with a mental status change of unknown cause. FINDINGS: BRAIN AND VENTRICLES: No acute hemorrhage. No evidence of acute infarct. No hydrocephalus. No extra-axial collection. No mass effect or midline shift. Chronic mineralization in basal ganglia and left cerebellum. ORBITS: No acute abnormality. SINUSES: No acute abnormality. SOFT TISSUES AND SKULL: No acute soft tissue abnormality. No skull fracture. There is underpneumatization of the mastoid temporal bones bilaterally. Chronic appearing bilateral nasal bone deformities. IMPRESSION: 1. No acute intracranial abnormality. Electronically signed by: Donnice Mania MD 07/23/2024 11:54 AM EST RP Workstation: [id number]    Microbiology: Results for orders placed or performed during the hospital encounter of  07/23/24  Blood Culture (routine x 2)     Status: None (Preliminary result)   Collection Time: 07/23/24 11:02 AM   Specimen: BLOOD  Result Value Ref Range Status   Specimen Description   Final    BLOOD RIGHT ANTECUBITAL Performed at [facility], [address] [place], [place] [address]    Special Requests   Final    Blood Culture adequate volume BOTTLES DRAWN AEROBIC AND ANAEROBIC Performed at [facility], [address], [place], [place] [address]    Culture   Final    NO GROWTH 2 DAYS Performed at [facility], [address], [place], [place] [address]    Report Status PENDING  Incomplete  Blood Culture (routine x 2)     Status: Abnormal   Collection Time: 07/23/24 11:07 AM   Specimen: BLOOD  Result Value Ref Range Status   Specimen Description   Final    BLOOD LEFT ANTECUBITAL Performed at [facility], [address] [place], [place] [address]    Special Requests   Final    Blood Culture adequate volume BOTTLES DRAWN AEROBIC AND ANAEROBIC Performed at [facility], [address] [place], [place] [address]    Culture  Setup Time   Final    GRAM POSITIVE COCCI IN CLUSTERS ANAEROBIC  BOTTLE ONLY CRITICAL RESULT CALLED TO, READ BACK BY AND VERIFIED WITH: PHARMD J. [facility] [id number] @ 2315 FH    Culture (A)  Final    MICROCOCCUS LUTEUS/LYLAE Standardized susceptibility testing for this organism is not available. Performed at [facility], [address] [place], [place] [address]    Report Status 07/25/2024 FINAL  Final  Blood Culture ID Panel (Reflexed)     Status: None   Collection Time: 07/23/24 11:07 AM  Result Value Ref Range Status   Enterococcus faecalis NOT DETECTED NOT DETECTED Final   Enterococcus Faecium NOT DETECTED NOT DETECTED Final   Listeria monocytogenes NOT DETECTED NOT DETECTED Final   Staphylococcus species NOT DETECTED NOT DETECTED Final   Staphylococcus  aureus (BCID) NOT DETECTED NOT DETECTED Final   Staphylococcus epidermidis NOT DETECTED NOT DETECTED Final   Staphylococcus lugdunensis NOT DETECTED NOT DETECTED Final   Streptococcus species NOT DETECTED NOT DETECTED Final   Streptococcus agalactiae NOT DETECTED NOT DETECTED Final   Streptococcus pneumoniae NOT DETECTED NOT DETECTED Final   Streptococcus pyogenes NOT DETECTED NOT DETECTED Final   A.calcoaceticus-baumannii NOT DETECTED NOT DETECTED Final   Bacteroides fragilis NOT DETECTED NOT DETECTED Final   Enterobacterales NOT DETECTED NOT DETECTED Final   Enterobacter cloacae complex NOT DETECTED NOT DETECTED Final   Escherichia coli NOT DETECTED NOT DETECTED Final   Klebsiella aerogenes NOT DETECTED NOT DETECTED Final   Klebsiella oxytoca NOT DETECTED NOT DETECTED Final   Klebsiella pneumoniae NOT DETECTED NOT DETECTED Final   Proteus species NOT DETECTED NOT DETECTED Final   Salmonella species NOT DETECTED NOT DETECTED Final   Serratia marcescens NOT DETECTED NOT DETECTED Final   Haemophilus influenzae NOT DETECTED NOT DETECTED Final   Neisseria meningitidis NOT DETECTED NOT DETECTED Final   Pseudomonas aeruginosa NOT DETECTED NOT DETECTED Final   Stenotrophomonas maltophilia NOT DETECTED NOT DETECTED Final   Candida albicans NOT DETECTED NOT DETECTED Final   Candida auris NOT DETECTED NOT DETECTED Final   Candida glabrata NOT DETECTED NOT DETECTED Final   Candida krusei NOT DETECTED NOT DETECTED Final   Candida parapsilosis NOT DETECTED NOT DETECTED Final   Candida tropicalis NOT DETECTED NOT DETECTED Final   Cryptococcus neoformans/gattii NOT DETECTED NOT DETECTED Final    Comment: Performed at [facility], [address], [place], [place] [address]  Resp panel by RT-PCR (RSV, Flu A&B, Covid) Urine, Clean Catch     Status: None   Collection Time: 07/23/24 12:40 PM   Specimen: Urine, Clean Catch; Nasal Swab  Result Value Ref Range Status   SARS Coronavirus 2 by RT PCR  NEGATIVE NEGATIVE Final    Comment: (NOTE) SARS-CoV-2 target nucleic acids are NOT DETECTED.  The SARS-CoV-2 RNA is generally detectable in upper respiratory specimens during the acute phase of infection. The lowest concentration of SARS-CoV-2 viral copies this assay can detect is 138 copies/mL. A negative result does not preclude SARS-Cov-2 infection and should not be used as the sole basis for treatment or other patient management decisions. A negative result may occur with  improper specimen collection/handling, submission of specimen other than nasopharyngeal swab, presence of viral mutation(s) within the areas targeted by this assay, and inadequate number of viral copies(<138 copies/mL). A negative result must be combined with clinical observations, patient history, and epidemiological information. The expected result is Negative.  Fact Sheet for Patients:  bloggercourse.com  Fact Sheet for Healthcare Providers:  seriousbroker.it  This test is no t yet approved or cleared by  the [place]  FDA and  has been authorized for detection and/or diagnosis of SARS-CoV-2 by FDA under an Emergency Use Authorization (EUA). This EUA will remain  in effect (meaning this test can be used) for the duration of the COVID-19 declaration under Section 564(b)(1) of the Act, 21 U.S.C.section 360bbb-3(b)(1), unless the authorization is terminated  or revoked sooner.       Influenza A by PCR NEGATIVE NEGATIVE Final   Influenza B by PCR NEGATIVE NEGATIVE Final    Comment: (NOTE) The Xpert Xpress SARS-CoV-2/FLU/RSV plus assay is intended as an aid in the diagnosis of influenza from Nasopharyngeal swab specimens and should not be used as a sole basis for treatment. Nasal washings and aspirates are unacceptable for Xpert Xpress SARS-CoV-2/FLU/RSV testing.  Fact Sheet for Patients: bloggercourse.com  Fact Sheet for  Healthcare Providers: seriousbroker.it  This test is not yet approved or cleared by the [place]  FDA and has been authorized for detection and/or diagnosis of SARS-CoV-2 by FDA under an Emergency Use Authorization (EUA). This EUA will remain in effect (meaning this test can be used) for the duration of the COVID-19 declaration under Section 564(b)(1) of the Act, 21 U.S.C. section 360bbb-3(b)(1), unless the authorization is terminated or revoked.     Resp Syncytial Virus by PCR NEGATIVE NEGATIVE Final    Comment: (NOTE) Fact Sheet for Patients: bloggercourse.com  Fact Sheet for Healthcare Providers: seriousbroker.it  This test is not yet approved or cleared by the [place]  FDA and has been authorized for detection and/or diagnosis of SARS-CoV-2 by FDA under an Emergency Use Authorization (EUA). This EUA will remain in effect (meaning this test can be used) for the duration of the COVID-19 declaration under Section 564(b)(1) of the Act, 21 U.S.C. section 360bbb-3(b)(1), unless the authorization is terminated or revoked.  Performed at [facility], [address], [place], [place] [address]     Labs: CBC: Recent Labs  Lab 07/23/24 1020 07/23/24 1347 07/24/24 0641 07/25/24 0532  WBC 13.6*  --  8.7 7.7  NEUTROABS 11.7*  --  6.9 5.1  HGB 15.1 12.9* 13.3 12.3*  HCT 43.1 38.0* 37.2* 34.8*  MCV 81.3  --  79.8* 81.3  PLT 243  --  202 206   Basic Metabolic Panel: Recent Labs  Lab 07/23/24 1020 07/23/24 1347 07/24/24 0641 07/25/24 0532  NA 136 138 136 140  K 4.7 4.2 4.2 4.5  CL 95*  --  95* 103  CO2 30  --  31 31  GLUCOSE 365*  --  457* 176*  BUN 29*  --  23* 22*  CREATININE 1.66*  --  1.54* 1.38*  CALCIUM 10.3  --  9.3 9.0  MG 2.0  --   --   --   PHOS 4.4  --   --   --    Liver Function Tests: Recent Labs  Lab 07/23/24 1020  AST 29  ALT 31  ALKPHOS 159*   BILITOT 0.4  PROT 7.7  ALBUMIN 4.0   CBG: Recent Labs  Lab 07/24/24 1152 07/24/24 1611 07/24/24 2107 07/25/24 0834 07/25/24 1122  GLUCAP 273* 89 151* 126* 255*    Discharge time spent: 35 minutes.  Signed: Elgin Lam, MD [facility] 07/25/2024 "

## 2024-07-25 NOTE — Inpatient Diabetes Management (Signed)
 Inpatient Diabetes Program Recommendations  AACE/ADA: New Consensus Statement on Inpatient Glycemic Control (2015)  Target Ranges:  Prepandial:   less than 140 mg/dL      Peak postprandial:   less than 180 mg/dL (1-2 hours)      Critically ill patients:  140 - 180 mg/dL   Lab Results  Component Value Date   GLUCAP 126 (H) 07/25/2024   HGBA1C 11.4 (H) 07/24/2024    Review of Glycemic Control  Latest Reference Range & Units 07/24/24 08:11 07/24/24 09:14 07/24/24 11:52 07/24/24 16:11 07/24/24 21:07 07/25/24 08:34  Glucose-Capillary 70 - 99 mg/dL 530 (H) 427 (HH) 726 (H) 89 151 (H) 126 (H)  (HH): Data is critically high (H): Data is abnormally high  Diabetes history: DM2 Outpatient Diabetes medications: Glipizide  10 mg QD Current orders for Inpatient glycemic control: Semglee  10 units every day, Novolog  0-9 units TID  Met with mother at bedside.  Reviewed patient's current A1c of 11.4% (average BG of 280 mg/dL). Explained what a A1c is and what it measures. Also reviewed goal A1c with patient, importance of good glucose control @ home, and blood sugar goals.  He lives in a group home and she is unsure if the staff at the group home can administer insulin .  She states they are supposed to check his CBGs.  Asked to to educate the group home staff on avoiding caloric beverages and being mindful of carbohydrates.   If staff is unable to administer insulin , might consider increasing Glipizide  to BID.  He has a PCP who has recommended insulin  in the past.  Mom is going to check with group home about ability to administer insulin .    Discharge Recommendations: Other recommendations: continue glipizide  Long acting recommendations: Insulin  Glargine (LANTUS ) Solostar Pen 10 unit sQD    Use Adult Diabetes Insulin  Treatment Post Discharge order set.  Thank you, Wyvonna Pinal, MSN, CDCES Diabetes Coordinator Inpatient Diabetes Program [phone] (team pager from 8a-5p)

## 2024-07-25 NOTE — TOC Progression Note (Signed)
 Transition of Care [facility]) - Progression Note    Patient Details  Name: Ryan Case MRN: [id number] Date of Birth: [birth year]  [facility] of [facility] CM/SW Contact  Luann SHAUNNA [place], [place] Phone Number: 07/25/2024, 2:13 PM  Clinical Narrative:     CSW spoke with group home director, Ubaldo Hint, over the phone. Explained PT rec for home health and increased assistance when pt returns to group home. Director explains they would be able manage increased assistance with mobility. No preference on [facility] agency. Director explained they will arrive around 1500. To pick pt up and transport home. CSW notified RNCM.    Expected Discharge Plan: Group Home Barriers to Discharge: Barriers Resolved               Expected Discharge Plan and Services In-house Referral: Clinical Social Work Discharge Planning Services: CM Consult Post Acute Care Choice: Home Health, Durable Medical Equipment Living arrangements for the past 2 months: Group Home Expected Discharge Date: 07/25/24               DME Arranged: Vannie rolling DME Agency: [employer] Date DME Agency Contacted: 07/25/24 Time DME Agency Contacted: 1302 Representative spoke with at DME Agency: [facility] HH Arranged: PT, OT HH Agency: [facility] Date HH Agency Contacted: 07/25/24 Time HH Agency Contacted: 1302 Representative spoke with at [facility] Agency: [facility]   Social Drivers of Health (SDOH) Interventions SDOH Screenings   Food Insecurity: Patient Unable To Answer (07/24/2024)  Housing: Patient Unable To Answer (07/24/2024)  Transportation Needs: Patient Unable To Answer (07/24/2024)  Utilities: Patient Unable To Answer (07/24/2024)  Tobacco Use: Medium Risk (07/23/2024)    Readmission Risk Interventions     No data to display

## 2024-07-25 NOTE — Care Management Obs Status (Signed)
 MEDICARE OBSERVATION STATUS NOTIFICATION   Patient Details  Name: Ryan Case MRN: [id number] Date of Birth: Nov 01, 1970   Medicare Observation Status Notification Given:  Yes    Jennie Laneta Dragon 07/25/2024, 2:01 PM

## 2024-07-28 LAB — CULTURE, BLOOD (ROUTINE X 2)
Culture: NO GROWTH
Special Requests: ADEQUATE

## 2024-08-04 ENCOUNTER — Ambulatory Visit: Admitting: Podiatry

## 2024-08-20 ENCOUNTER — Ambulatory Visit: Admitting: Podiatry
